# Patient Record
Sex: Female | Born: 1950 | Race: Asian | Hispanic: No | Marital: Married | Smoking: Never smoker
Health system: Southern US, Community
[De-identification: ages and names within clinical notes are randomized; demographics above are authoritative.]

## PROBLEM LIST (undated history)

## (undated) DIAGNOSIS — H43812 Vitreous degeneration, left eye: Secondary | ICD-10-CM

## (undated) DIAGNOSIS — E559 Vitamin D deficiency, unspecified: Secondary | ICD-10-CM

## (undated) DIAGNOSIS — E785 Hyperlipidemia, unspecified: Secondary | ICD-10-CM

## (undated) DIAGNOSIS — E119 Type 2 diabetes mellitus without complications: Secondary | ICD-10-CM

## (undated) HISTORY — DX: Vitamin D deficiency, unspecified: E55.9

## (undated) HISTORY — DX: Hyperlipidemia, unspecified: E78.5

## (undated) HISTORY — DX: Type 2 diabetes mellitus without complications: E11.9

## (undated) HISTORY — DX: Vitreous degeneration, left eye: H43.812

---

## 1998-07-10 ENCOUNTER — Other Ambulatory Visit: Admission: RE | Admit: 1998-07-10 | Discharge: 1998-07-10 | Payer: Self-pay | Admitting: Obstetrics and Gynecology

## 2001-06-29 ENCOUNTER — Encounter: Payer: Self-pay | Admitting: Family Medicine

## 2001-06-29 ENCOUNTER — Encounter: Admission: RE | Admit: 2001-06-29 | Discharge: 2001-06-29 | Payer: Self-pay | Admitting: Family Medicine

## 2002-06-04 ENCOUNTER — Encounter: Admission: RE | Admit: 2002-06-04 | Discharge: 2002-06-04 | Payer: Self-pay | Admitting: Family Medicine

## 2002-06-04 ENCOUNTER — Encounter: Payer: Self-pay | Admitting: Family Medicine

## 2004-07-26 ENCOUNTER — Other Ambulatory Visit: Admission: RE | Admit: 2004-07-26 | Discharge: 2004-07-26 | Payer: Self-pay | Admitting: *Deleted

## 2005-08-12 ENCOUNTER — Encounter: Admission: RE | Admit: 2005-08-12 | Discharge: 2005-08-12 | Payer: Self-pay | Admitting: Family Medicine

## 2005-12-25 ENCOUNTER — Other Ambulatory Visit: Admission: RE | Admit: 2005-12-25 | Discharge: 2005-12-25 | Payer: Self-pay | Admitting: Family Medicine

## 2007-01-12 ENCOUNTER — Other Ambulatory Visit: Admission: RE | Admit: 2007-01-12 | Discharge: 2007-01-12 | Payer: Self-pay | Admitting: Family Medicine

## 2008-06-08 ENCOUNTER — Other Ambulatory Visit: Admission: RE | Admit: 2008-06-08 | Discharge: 2008-06-08 | Payer: Self-pay | Admitting: Family Medicine

## 2009-11-28 ENCOUNTER — Other Ambulatory Visit: Admission: RE | Admit: 2009-11-28 | Discharge: 2009-11-28 | Payer: Self-pay | Admitting: Family Medicine

## 2009-12-25 ENCOUNTER — Encounter: Admission: RE | Admit: 2009-12-25 | Discharge: 2009-12-25 | Payer: Self-pay | Admitting: Family Medicine

## 2010-12-05 ENCOUNTER — Other Ambulatory Visit: Payer: Self-pay | Admitting: Family Medicine

## 2010-12-05 ENCOUNTER — Other Ambulatory Visit
Admission: RE | Admit: 2010-12-05 | Discharge: 2010-12-05 | Payer: Self-pay | Source: Home / Self Care | Admitting: Family Medicine

## 2011-09-22 ENCOUNTER — Other Ambulatory Visit: Payer: Self-pay | Admitting: Family Medicine

## 2011-09-22 DIAGNOSIS — Z1231 Encounter for screening mammogram for malignant neoplasm of breast: Secondary | ICD-10-CM

## 2011-10-13 ENCOUNTER — Ambulatory Visit
Admission: RE | Admit: 2011-10-13 | Discharge: 2011-10-13 | Disposition: A | Discharge status: Home / Self Care | Payer: BC Managed Care – PPO | Source: Ambulatory Visit | Attending: Family Medicine | Admitting: Family Medicine

## 2011-10-13 DIAGNOSIS — Z1231 Encounter for screening mammogram for malignant neoplasm of breast: Secondary | ICD-10-CM

## 2014-07-27 ENCOUNTER — Other Ambulatory Visit: Payer: Self-pay | Admitting: Family Medicine

## 2014-07-27 ENCOUNTER — Other Ambulatory Visit (HOSPITAL_COMMUNITY)
Admission: RE | Admit: 2014-07-27 | Discharge: 2014-07-27 | Disposition: A | Discharge status: Home / Self Care | Payer: BC Managed Care – PPO | Source: Ambulatory Visit | Attending: Family Medicine | Admitting: Family Medicine

## 2014-07-27 DIAGNOSIS — Z124 Encounter for screening for malignant neoplasm of cervix: Secondary | ICD-10-CM | POA: Diagnosis not present

## 2014-07-31 LAB — CYTOLOGY - PAP

## 2015-10-26 ENCOUNTER — Ambulatory Visit (INDEPENDENT_AMBULATORY_CARE_PROVIDER_SITE_OTHER): Payer: BC Managed Care – PPO | Admitting: Neurology

## 2015-10-26 ENCOUNTER — Other Ambulatory Visit: Payer: Self-pay | Admitting: Family Medicine

## 2015-10-26 ENCOUNTER — Encounter: Payer: Self-pay | Admitting: Neurology

## 2015-10-26 VITALS — BP 104/64 | HR 81 | Ht 64.0 in | Wt 111.4 lb

## 2015-10-26 DIAGNOSIS — Z1231 Encounter for screening mammogram for malignant neoplasm of breast: Secondary | ICD-10-CM

## 2015-10-26 DIAGNOSIS — G451 Carotid artery syndrome (hemispheric): Secondary | ICD-10-CM | POA: Diagnosis not present

## 2015-10-26 DIAGNOSIS — E1159 Type 2 diabetes mellitus with other circulatory complications: Secondary | ICD-10-CM | POA: Diagnosis not present

## 2015-10-26 DIAGNOSIS — E785 Hyperlipidemia, unspecified: Secondary | ICD-10-CM | POA: Diagnosis not present

## 2015-10-26 DIAGNOSIS — G459 Transient cerebral ischemic attack, unspecified: Secondary | ICD-10-CM | POA: Insufficient documentation

## 2015-10-26 MED ORDER — ASPIRIN EC 81 MG PO TBEC: 81.0000 mg | DELAYED_RELEASE_TABLET | Freq: Every day | ORAL | Status: AC

## 2015-10-26 NOTE — Patient Instructions (Signed)
-   recommend ASA 81mg  for stroke prevention - recommend statin meds for high LDL and please discuss with Dr. Kevan NyGates, goal LDL < 70 - will do MRI and MRA head, carotid doppler and 2D echo for TIA work up - Follow up with your primary care physician for stroke risk factor modification. Recommend maintain blood pressure goal <130/80, diabetes with hemoglobin A1c goal below 6.5% and lipids with LDL cholesterol goal below 70 mg/dL.  - follow up in 2 months

## 2015-10-26 NOTE — Progress Notes (Signed)
NEUROLOGY CLINIC NEW PATIENT NOTE  NAME: Brandy Doyle DOB: Feb 14, 1951 REFERRING PHYSICIAN: Darcus Austin, MD  I saw Brandy Doyle as a new consult in the neurovascular clinic today regarding  Chief Complaint  Patient presents with  . Referral    from Darcus Austin MD  .  HPI: Brandy Doyle is a 64 y.o. female with PMH of DM and HLD who presents as a new patient for episodes of blurry vision or left leg weakness.   Pt stated that she had two episode of blurry vision. The first one was last summer when she was driving, she had sudden onset binocular blurry vision. She blinked her eyes but did not work. She pulled over and the vision back to baseline in 31mn. The second episode happened this year lasted about 133m. No associated HA, double vision, speech difficulty or facial droop, weakness or numbness.   About several months ago, she had one episode of left leg weakness sudden onset on walking. She has to sit down and the episode lasted about 1531mand resolved. She denies any left arm or face weakness or numbness.   She has hx of HLD and stated it was getting better. However the most recent LDL was 143. She has not been on any meds but insisted to try diet control. She also has DM for 20 years, and on Prandin for DM control and recent A1C 7.3. She denies HTN. Denies smoking, alcohol or illicit drugs. She admitted snoring but denies apnea or daytime sleepiness.  She has FHx of HLD in mom and sister and brother. Mom had MI/CAD and sister died of sudden death. Brother recently was diagnosed with stroke and carotid disease.   Past Medical History  Diagnosis Date  . [place] complication ([place] . Vitamin D deficiency   . Hyperlipemia   . Vitreous detachment of left eye    No past surgical history on file. Family History  Problem Relation Age of Onset  . Migraines Brother    Current Outpatient Prescriptions  Medication Sig Dispense Refill  . Blood Glucose Monitoring Suppl  (ONE TOUCH ULTRA MINI) W/DEVICE KIT     . Calcium Citrate-Vitamin D (CALCIUM + D PO) Take by mouth.    . ONE TOUCH ULTRA TEST test strip     . ONETOUCH DELICA LANCETS [id number]     . repaglinide (PRANDIN) 0.5 MG tablet     . Vitamin D, Ergocalciferol, (DRISDOL) 50000 UNITS CAPS capsule     . aspirin EC 81 MG tablet Take 1 tablet (81 mg total) by mouth daily. 30 tablet 5   No current facility-administered medications for this visit.   Allergies  Allergen Reactions  . Erythromycin Base Swelling    The ointment cause swelling  . Penicillins Rash   Social History   Social History  . Marital Status: Married    Spouse Name: N/A  . Number of Children: N/A  . Years of Education: N/A   Occupational History  . Not on file.   Social History Main Topics  . Smoking status: Never Smoker   . Smokeless tobacco: Not on file  . Alcohol Use: No  . Drug Use: Not on file  . Sexual Activity: Not on file   Other Topics Concern  . Not on file   Social History Narrative    Review of Systems Full 14 system review of systems performed and notable only for those listed, all others are neg:  Constitutional:  Cardiovascular:  Ear/Nose/Throat:   Skin:  Eyes:  Blurry vision Respiratory:  snoring Gastroitestinal:   Genitourinary:  Hematology/Lymphatic:   Endocrine:  Musculoskeletal:   Allergy/Immunology:  allergies Neurological:  HA Psychiatric:  Sleep:    Physical Exam  Filed Vitals:   10/26/15 0939  BP: 104/64  Pulse: 81    General - Well nourished, well developed, in no apparent distress.  Ophthalmologic - Sharp disc margins OU.  Cardiovascular - Regular rate and rhythm with no murmur.    Neck - supple, no nuchal rigidity.  Mental Status -  Level of arousal and orientation to time, place, and person were intact. Language including expression, naming, repetition, comprehension, reading, and writing was assessed and found intact. Attention span and concentration were  normal. Recent and remote memory were intact. Fund of Knowledge was assessed and was intact.  Cranial Nerves II - XII - II - Visual field intact OU. III, IV, VI - Extraocular movements intact. V - Facial sensation intact bilaterally. VII - Facial movement intact bilaterally. VIII - Hearing & vestibular intact bilaterally. X - Palate elevates symmetrically. XI - Chin turning & shoulder shrug intact bilaterally. XII - Tongue protrusion intact.  Motor Strength - The patient's strength was normal in all extremities and pronator drift was absent.  Bulk was normal and fasciculations were absent.   Motor Tone - Muscle tone was assessed at the neck and appendages and was normal.  Reflexes - The patient's reflexes were normal in all extremities and she had no pathological reflexes.  Sensory - Light touch, temperature/pinprick, vibration and proprioception, and Romberg testing were assessed and were normal.    Coordination - The patient had normal movements in the hands and feet with no ataxia or dysmetria.  Tremor was absent.  Gait and Station - The patient's transfers, posture, gait, station, and turns were observed as normal.   Imaging None  Lab Review 10/22/15 A1C 6.3, LDL 143, HDL 40, chol 223, TG 200, Cre 0.59    Assessment and Plan:   In summary, Brandy Doyle is a 64 y.o. female with PMH of HLD and DM presents for evaluation of transient episodes of binocular blurry vision or left leg weakness. The episodes concerning for TIAs given her risk factors and time course of the episodes. She will need TIA work up. Will do MRI and MRA, CUS, TTE. Recommend to start ASA and statin. Pt would like to discuss with Dr. Inda Merlin for statin choices.   - ASA 37m for stroke prevention - recommend statin meds for high LDL and pt would discuss with Dr. GInda Merlin goal LDL < 70 - MRI and MRA head, CUS and 2D echo for TIA work up - Follow up with your primary care physician for stroke risk factor  modification. Recommend maintain blood pressure goal <130/80, diabetes with hemoglobin A1c goal below 6.5% and lipids with LDL cholesterol goal below 70 mg/dL.  - follow up in 2 months  I recommend aggressive blood pressure control with a goal <130/80 mm Hg.  Lipids should be managed intensively, with a goal LDL < 70 mg/dL.  I encouraged the patient to discuss these important issues with her primary care physician.  I counseled the patient on measures to reduce stroke risk, including the importance of medication compliance, risk factor control, exercise, healthy diet, and avoidance of smoking.  I reviewed stroke warning signs and symptoms and appropriate actions to take if such occurs.   Thank you very much for the opportunity to participate in  the care of this patient.  Please do not hesitate to call if any questions or concerns arise.  Orders Placed This Encounter  Procedures  . MR Brain Wo Contrast    Standing Status: Future     Number of Occurrences:      Standing Expiration Date: 12/27/2016    Order Specific Question:  Reason for Exam (SYMPTOM  OR DIAGNOSIS REQUIRED)    Answer:  TIA    Order Specific Question:  Preferred imaging location?    Answer:  Internal    Order Specific Question:  Does the patient have a pacemaker or implanted devices?    Answer:  Yes    Order Specific Question:  What is the patient's sedation requirement?    Answer:  No Sedation  . MR MRA HEAD WO CONTRAST    Standing Status: Future     Number of Occurrences:      Standing Expiration Date: 12/27/2016    Order Specific Question:  Reason for Exam (SYMPTOM  OR DIAGNOSIS REQUIRED)    Answer:  TIA    Order Specific Question:  Preferred imaging location?    Answer:  Internal    Order Specific Question:  Does the patient have a pacemaker or implanted devices?    Answer:  Yes    Order Specific Question:  What is the patient's sedation requirement?    Answer:  No Sedation  . ECHOCARDIOGRAM COMPLETE    Standing  Status: Future     Number of Occurrences:      Standing Expiration Date: 01/24/2017    Order Specific Question:  Where should this test be performed    Answer:  [facility])    Order Specific Question:  Complete or Limited study?    Answer:  Complete    Order Specific Question:  With Image Enhancing Agent or without Image Enhancing Agent?    Answer:  With Image Enhancing Agent    Order Specific Question:  Reason for exam-Echo    Answer:  TIA  435.9 / G45.9    Meds ordered this encounter  Medications  . ONETOUCH DELICA LANCETS 98M MISC    Sig:   . ONE TOUCH ULTRA TEST test strip    Sig:   . Blood Glucose Monitoring Suppl (ONE TOUCH ULTRA MINI) W/DEVICE KIT    Sig:   . Vitamin D, Ergocalciferol, (DRISDOL) 50000 UNITS CAPS capsule    Sig:   . repaglinide (PRANDIN) 0.5 MG tablet    Sig:   . Calcium Citrate-Vitamin D (CALCIUM + D PO)    Sig: Take by mouth.  Marland Kitchen aspirin EC 81 MG tablet    Sig: Take 1 tablet (81 mg total) by mouth daily.    Dispense:  30 tablet    Refill:  5    Patient Instructions  - recommend ASA 51m for stroke prevention - recommend statin meds for high LDL and please discuss with Dr. GInda Merlin goal LDL < 70 - will do MRI and MRA head, carotid doppler and 2D echo for TIA work up - Follow up with your primary care physician for stroke risk factor modification. Recommend maintain blood pressure goal <130/80, diabetes with hemoglobin A1c goal below 6.5% and lipids with LDL cholesterol goal below 70 mg/dL.  - follow up in 2 months   JRosalin Hawking MD PhD [facility] [address] [place]  [phone]

## 2015-11-07 ENCOUNTER — Encounter (HOSPITAL_COMMUNITY): Payer: BC Managed Care – PPO

## 2015-11-14 ENCOUNTER — Other Ambulatory Visit (HOSPITAL_COMMUNITY): Payer: BC Managed Care – PPO

## 2015-11-20 ENCOUNTER — Encounter (HOSPITAL_COMMUNITY): Payer: BC Managed Care – PPO

## 2015-11-21 ENCOUNTER — Ambulatory Visit (INDEPENDENT_AMBULATORY_CARE_PROVIDER_SITE_OTHER): Payer: BC Managed Care – PPO

## 2015-11-21 DIAGNOSIS — G451 Carotid artery syndrome (hemispheric): Secondary | ICD-10-CM

## 2015-11-22 ENCOUNTER — Ambulatory Visit (HOSPITAL_COMMUNITY): Payer: BC Managed Care – PPO | Attending: Cardiology

## 2015-11-22 ENCOUNTER — Other Ambulatory Visit: Payer: Self-pay

## 2015-11-22 DIAGNOSIS — G451 Carotid artery syndrome (hemispheric): Secondary | ICD-10-CM | POA: Diagnosis not present

## 2015-11-22 DIAGNOSIS — I5189 Other ill-defined heart diseases: Secondary | ICD-10-CM | POA: Diagnosis not present

## 2015-11-22 DIAGNOSIS — E785 Hyperlipidemia, unspecified: Secondary | ICD-10-CM | POA: Diagnosis not present

## 2015-11-22 DIAGNOSIS — E119 Type 2 diabetes mellitus without complications: Secondary | ICD-10-CM | POA: Diagnosis not present

## 2015-11-22 DIAGNOSIS — I071 Rheumatic tricuspid insufficiency: Secondary | ICD-10-CM | POA: Insufficient documentation

## 2015-11-22 DIAGNOSIS — G459 Transient cerebral ischemic attack, unspecified: Secondary | ICD-10-CM | POA: Diagnosis present

## 2015-11-23 ENCOUNTER — Telehealth: Payer: Self-pay

## 2015-11-23 NOTE — Telephone Encounter (Signed)
-----   Message from Marvel PlanJindong Xu, MD sent at 11/23/2015  1:10 PM EST ----- Hi, Brandy Doyle:  Could you please let the patient know that her MRI brain and vessel images as well as the heart ultrasound were all normal. Still waiting for the carotid artery ultrasound test. Please continue current treatment. Thanks.  In addition, could you please find out her carotid doppler status? I have ordered on 10/26/15. Thanks.  Marvel PlanJindong Xu, MD PhD Stroke Neurology 11/23/2015 1:10 PM

## 2015-11-23 NOTE — Telephone Encounter (Signed)
LFt vm for patient to call back about her MRI, and heart ultrasound. Pt was schedule to have carotid dopplers done, but she cancel both appts according to the appts.

## 2015-11-26 ENCOUNTER — Ambulatory Visit (HOSPITAL_COMMUNITY)
Admission: RE | Admit: 2015-11-26 | Discharge: 2015-11-26 | Disposition: A | Discharge status: Home / Self Care | Payer: BC Managed Care – PPO | Source: Ambulatory Visit | Attending: Neurology | Admitting: Neurology

## 2015-11-26 ENCOUNTER — Other Ambulatory Visit: Payer: Self-pay | Admitting: Neurology

## 2015-11-26 DIAGNOSIS — G451 Carotid artery syndrome (hemispheric): Secondary | ICD-10-CM

## 2015-11-26 DIAGNOSIS — I6523 Occlusion and stenosis of bilateral carotid arteries: Secondary | ICD-10-CM | POA: Insufficient documentation

## 2015-11-26 DIAGNOSIS — E785 Hyperlipidemia, unspecified: Secondary | ICD-10-CM | POA: Diagnosis not present

## 2015-11-26 DIAGNOSIS — E119 Type 2 diabetes mellitus without complications: Secondary | ICD-10-CM | POA: Insufficient documentation

## 2015-11-26 NOTE — Telephone Encounter (Signed)
-----   Message from Jindong Xu, MD sent at 11/23/2015  1:10 PM EST ----- Hi, Brandy Doyle:  Could you please let the patient know that her MRI brain and vessel images as well as the heart ultrasound were all normal. Still waiting for the carotid artery ultrasound test. Please continue current treatment. Thanks.  In addition, could you please find out her carotid doppler status? I have ordered on 10/26/15. Thanks.  Jindong Xu, MD PhD Stroke Neurology 11/23/2015 1:10 PM     

## 2015-11-26 NOTE — Telephone Encounter (Signed)
Rn receive incoming call from patient that her MRI brain was normal, heart ultrasound was normal. Pt verbalized understanding of the test. Rn [facility] patient [facility]

## 2015-11-26 NOTE — Telephone Encounter (Signed)
LFt vm for patient about MR results. [facility].

## 2015-11-28 ENCOUNTER — Telehealth: Payer: Self-pay | Admitting: *Deleted

## 2015-11-28 NOTE — Telephone Encounter (Signed)
Called pt and relayed results below per Dr Roda Shutters. Pt verbalized understanding.

## 2015-11-28 NOTE — Telephone Encounter (Signed)
-----   Message from Marvel Plan, MD sent at 11/27/2015  8:52 PM EST ----- Could you please let the patient know that the carotid doppler test done recently was negative for any significant narrowing. Please continue current treatment. Thanks.  Marvel Plan, MD PhD Stroke Neurology 11/27/2015 8:52 PM

## 2015-12-20 ENCOUNTER — Ambulatory Visit
Admission: RE | Admit: 2015-12-20 | Discharge: 2015-12-20 | Disposition: A | Discharge status: Home / Self Care | Payer: BC Managed Care – PPO | Source: Ambulatory Visit | Attending: Family Medicine | Admitting: Family Medicine

## 2015-12-20 DIAGNOSIS — Z1231 Encounter for screening mammogram for malignant neoplasm of breast: Secondary | ICD-10-CM

## 2016-01-17 ENCOUNTER — Ambulatory Visit (INDEPENDENT_AMBULATORY_CARE_PROVIDER_SITE_OTHER): Payer: BC Managed Care – PPO | Admitting: Neurology

## 2016-01-17 ENCOUNTER — Encounter: Payer: Self-pay | Admitting: Neurology

## 2016-01-17 VITALS — BP 106/67 | HR 77 | Ht 64.0 in | Wt 112.6 lb

## 2016-01-17 DIAGNOSIS — E785 Hyperlipidemia, unspecified: Secondary | ICD-10-CM | POA: Diagnosis not present

## 2016-01-17 DIAGNOSIS — G451 Carotid artery syndrome (hemispheric): Secondary | ICD-10-CM

## 2016-01-17 NOTE — Patient Instructions (Addendum)
-   continue ASA 81mg  for stroke prevention - recommend statin meds for high LDL and pt would discuss with Dr. Kevan NyGates, goal LDL < 70 - Follow up with your primary care physician for stroke risk factor modification. Recommend maintain blood pressure goal <130/80, diabetes with hemoglobin A1c goal below 6.5% and lipids with LDL cholesterol goal below 70 mg/dL.  - health diet and regular exercise.  - follow up as needed.

## 2016-01-17 NOTE — Progress Notes (Signed)
NEUROLOGY CLINIC NEW PATIENT NOTE  NAME: Brandy Doyle Doyle DOB: 04-10-1951 REFERRING PHYSICIAN: No ref. provider found  I saw Brandy Doyle Doyle as a new consult in the neurovascular clinic today regarding  Chief Complaint  Patient presents with  . Follow-up    Hemispheric carotid artery syndrome   .  HPI: Brandy Doyle Doyle is a 65 y.o. female with PMH of DM and HLD who presents as a new patient for episodes of blurry vision or left leg weakness.   Pt stated that she had two episode of blurry vision. The first one was last summer when she was driving, she had sudden onset binocular blurry vision. She blinked her eyes but did not work. She pulled over and the vision back to baseline in 37mn. The second episode happened this year lasted about 19m. No associated HA, double vision, speech difficulty or facial droop, weakness or numbness.   About several months ago, she had one episode of left leg weakness sudden onset on walking. She has to sit down and the episode lasted about 1525mand resolved. She denies any left arm or face weakness or numbness.   She has hx of HLD and stated it was getting better. However the most recent LDL was 143. She has not been on any meds but insisted to try diet control. She also has DM for 20 years, and on Prandin for DM control and recent A1C 7.3. She denies HTN. Denies smoking, alcohol or illicit drugs. She admitted snoring but denies apnea or daytime sleepiness.  She has FHx of HLD in mom and sister and brother. Mom had MI/CAD and sister died of sudden death. Brother recently was diagnosed with stroke and carotid disease.   Interval history: During the interval time, pt has been doing well. No recurrent symptoms. MRI and MRA, CUS and TTE all negative. She is doing regular exercise and health diet. But not compliant with ASA 81 mg and not started statin yet. BP today 106/67.  Past Medical History  Diagnosis Date  . Diabetes [place] ([place] . Vitamin D  deficiency   . Hyperlipemia   . Vitreous detachment of left eye    History reviewed. No pertinent past surgical history. Family History  Problem Relation Age of Onset  . Migraines Brother    Current Outpatient Prescriptions  Medication Sig Dispense Refill  . aspirin EC 81 MG tablet Take 1 tablet (81 mg total) by mouth daily. 30 tablet 5  . Blood Glucose Monitoring Suppl (ONE TOUCH ULTRA MINI) W/DEVICE KIT     . Calcium Citrate-Vitamin D (CALCIUM + D PO) Take by mouth.    . ONE TOUCH ULTRA TEST test strip     . ONETOUCH DELICA LANCETS [id number]     . repaglinide (PRANDIN) 0.5 MG tablet     . Vitamin D, Ergocalciferol, (DRISDOL) 50000 UNITS CAPS capsule      No current facility-administered medications for this visit.   Allergies  Allergen Reactions  . Erythromycin Base Swelling    The ointment cause swelling  . Penicillins Rash   Social History   Social History  . Marital Status: Married    Spouse Name: N/A  . Number of Children: N/A  . Years of Education: N/A   Occupational History  . Not on file.   Social History Main Topics  . Smoking status: Never Smoker   . Smokeless tobacco: Not on file  . Alcohol Use: No  . Drug Use: Not on file  .  Sexual Activity: Not on file   Other Topics Concern  . Not on file   Social History Narrative    Review of Systems Full 14 system review of systems performed and notable only for those listed, all others are neg:  Constitutional:   Cardiovascular:  Ear/Nose/Throat:   Skin:  Eyes:  Blurry vision Respiratory:  snoring Gastroitestinal:   Genitourinary:  Hematology/Lymphatic:   Endocrine:  Musculoskeletal:   Allergy/Immunology:  allergies Neurological:  HA Psychiatric:  Sleep:    Physical Exam  Filed Vitals:   01/17/16 0909  BP: 106/67  Pulse: 77    General - Well nourished, well developed, in no apparent distress.  Ophthalmologic - Sharp disc margins OU.  Cardiovascular - Regular rate and rhythm with no  murmur.    Neck - supple, no nuchal rigidity.  Mental Status -  Level of arousal and orientation to time, place, and person were intact. Language including expression, naming, repetition, comprehension, reading, and writing was assessed and found intact. Attention span and concentration were normal. Recent and remote memory were intact. Fund of Knowledge was assessed and was intact.  Cranial Nerves II - XII - II - Visual field intact OU. III, IV, VI - Extraocular movements intact. V - Facial sensation intact bilaterally. VII - Facial movement intact bilaterally. VIII - Hearing & vestibular intact bilaterally. X - Palate elevates symmetrically. XI - Chin turning & shoulder shrug intact bilaterally. XII - Tongue protrusion intact.  Motor Strength - The patient's strength was normal in all extremities and pronator drift was absent.  Bulk was normal and fasciculations were absent.   Motor Tone - Muscle tone was assessed at the neck and appendages and was normal.  Reflexes - The patient's reflexes were normal in all extremities and she had no pathological reflexes.  Sensory - Light touch, temperature/pinprick, vibration and proprioception, and Romberg testing were assessed and were normal.    Coordination - The patient had normal movements in the hands and feet with no ataxia or dysmetria.  Tremor was absent.  Gait and Station - The patient's transfers, posture, gait, station, and turns were observed as normal.   Imaging MRI and MRA - normal MRI and MRA head.  CUS - Bilateral: 1-39% ICA stenosis. Vertebral artery flow is antegrade.  TTE -  - Normal LV function; mild diastolic dysfunction; trace TR.  Lab Review 10/22/15 A1C 6.3, LDL 143, HDL 40, chol 223, TG 200, Cre 0.59    Assessment:   In summary, Brandy Doyle Doyle is a 65 y.o. female with PMH of HLD and DM presents for evaluation of transient episodes of binocular blurry vision or left leg weakness. The episodes concerning for  TIAs given her risk factors and time course of the episodes. Work up including MRI and MRA, CUS, TTE all negative. Recommend to be compliant with ASA and discuss with Dr. Inda Doyle for statin choices.   Plan: - continue ASA 36m for stroke prevention - recommend statin meds for high LDL and pt would discuss with Dr. GInda Doyle goal LDL < 70 - Follow up with your primary care physician for stroke risk factor modification. Recommend maintain blood pressure goal <130/80, diabetes with hemoglobin A1c goal below 6.5% and lipids with LDL cholesterol goal below 70 mg/dL.  - health diet and regular exercise.  - follow up as needed.   No orders of the defined types were placed in this encounter.    No orders of the defined types were placed in this encounter.  Patient Instructions  - continue ASA 50m for stroke prevention - recommend statin meds for high LDL and pt would discuss with Dr. GInda Doyle goal LDL < 70 - Follow up with your primary care physician for stroke risk factor modification. Recommend maintain blood pressure goal <130/80, diabetes with hemoglobin A1c goal below 6.5% and lipids with LDL cholesterol goal below 70 mg/dL.  - health diet and regular exercise.  - follow up as needed.    JRosalin Hawking MD PhD [facility] [address] [place]andas[place]amp[place] [phone]

## 2016-01-24 ENCOUNTER — Other Ambulatory Visit: Payer: Self-pay | Admitting: Family Medicine

## 2016-01-24 DIAGNOSIS — R221 Localized swelling, mass and lump, neck: Secondary | ICD-10-CM

## 2016-01-28 ENCOUNTER — Ambulatory Visit
Admission: RE | Admit: 2016-01-28 | Discharge: 2016-01-28 | Disposition: A | Discharge status: Home / Self Care | Payer: BC Managed Care – PPO | Source: Ambulatory Visit | Attending: Family Medicine | Admitting: Family Medicine

## 2016-01-28 DIAGNOSIS — R221 Localized swelling, mass and lump, neck: Secondary | ICD-10-CM

## 2016-02-04 ENCOUNTER — Other Ambulatory Visit: Payer: Self-pay | Admitting: Family Medicine

## 2016-02-04 DIAGNOSIS — E041 Nontoxic single thyroid nodule: Secondary | ICD-10-CM

## 2016-02-06 ENCOUNTER — Other Ambulatory Visit (HOSPITAL_COMMUNITY)
Admission: RE | Admit: 2016-02-06 | Discharge: 2016-02-06 | Disposition: A | Discharge status: Home / Self Care | Payer: BC Managed Care – PPO | Source: Ambulatory Visit | Attending: Radiology | Admitting: Radiology

## 2016-02-06 ENCOUNTER — Ambulatory Visit
Admission: RE | Admit: 2016-02-06 | Discharge: 2016-02-06 | Disposition: A | Discharge status: Home / Self Care | Payer: BC Managed Care – PPO | Source: Ambulatory Visit | Attending: Family Medicine | Admitting: Family Medicine

## 2016-02-06 DIAGNOSIS — E041 Nontoxic single thyroid nodule: Secondary | ICD-10-CM

## 2016-03-27 ENCOUNTER — Other Ambulatory Visit: Payer: Self-pay | Admitting: Family Medicine

## 2016-03-27 DIAGNOSIS — E041 Nontoxic single thyroid nodule: Secondary | ICD-10-CM

## 2016-08-06 ENCOUNTER — Ambulatory Visit: Payer: BC Managed Care – PPO | Admitting: Internal Medicine

## 2016-12-16 ENCOUNTER — Other Ambulatory Visit: Payer: Self-pay | Admitting: Internal Medicine

## 2016-12-16 DIAGNOSIS — E042 Nontoxic multinodular goiter: Secondary | ICD-10-CM

## 2017-01-12 ENCOUNTER — Other Ambulatory Visit: Payer: BC Managed Care – PPO

## 2017-01-13 ENCOUNTER — Ambulatory Visit
Admission: RE | Admit: 2017-01-13 | Discharge: 2017-01-13 | Disposition: A | Discharge status: Home / Self Care | Payer: BC Managed Care – PPO | Source: Ambulatory Visit | Attending: Internal Medicine | Admitting: Internal Medicine

## 2017-01-13 DIAGNOSIS — E042 Nontoxic multinodular goiter: Secondary | ICD-10-CM

## 2017-11-27 ENCOUNTER — Other Ambulatory Visit: Payer: Self-pay | Admitting: Internal Medicine

## 2017-11-27 DIAGNOSIS — E042 Nontoxic multinodular goiter: Secondary | ICD-10-CM

## 2018-01-05 ENCOUNTER — Ambulatory Visit
Admission: RE | Admit: 2018-01-05 | Discharge: 2018-01-05 | Disposition: A | Discharge status: Home / Self Care | Payer: BC Managed Care – PPO | Source: Ambulatory Visit | Attending: Internal Medicine | Admitting: Internal Medicine

## 2018-01-05 DIAGNOSIS — E042 Nontoxic multinodular goiter: Secondary | ICD-10-CM

## 2019-12-22 ENCOUNTER — Ambulatory Visit: Payer: BC Managed Care – PPO | Attending: Family

## 2019-12-22 DIAGNOSIS — Z23 Encounter for immunization: Secondary | ICD-10-CM | POA: Insufficient documentation

## 2019-12-22 NOTE — Progress Notes (Signed)
   Covid-19 Vaccination Clinic  Name:  [name]    MRN: [id number] DOB: December 22, 1950  12/22/2019  Brandy Doyle was observed post Covid-19 immunization for 15 minutes without incidence. She was provided with Vaccine Information Sheet and instruction to access the V-Safe system.   [name] was instructed to call 911 with any severe reactions post vaccine: Marland Kitchen Difficulty breathing  . Swelling of your face and throat  . A fast heartbeat  . A bad rash all over your body  . Dizziness and weakness    Immunizations Administered    Name Date Dose VIS Date Route   Moderna COVID-19 Vaccine 12/22/2019  2:24 PM 0.5 mL 10/11/2019 Intramuscular   Manufacturer: Moderna   Lot: [id number]   NDC: 59093-112-16

## 2020-01-24 ENCOUNTER — Ambulatory Visit: Payer: BC Managed Care – PPO | Attending: Family

## 2020-01-24 DIAGNOSIS — Z23 Encounter for immunization: Secondary | ICD-10-CM

## 2020-01-24 NOTE — Progress Notes (Signed)
   Covid-19 Vaccination Clinic  Name:  Brandy Doyle    MRN: [id number] DOB: Sep 08, 1951  01/24/2020  [name] was observed post Covid-19 immunization for 15 minutes without incident. She was provided with Vaccine Information Sheet and instruction to access the V-Safe system.   [name] was instructed to call 911 with any severe reactions post vaccine: Marland Kitchen Difficulty breathing  . Swelling of face and throat  . A fast heartbeat  . A bad rash all over body  . Dizziness and weakness   Immunizations Administered    Name Date Dose VIS Date Route   Moderna COVID-19 Vaccine 01/24/2020  2:05 PM 0.5 mL 10/11/2019 Intramuscular   Manufacturer: Moderna   Lot: [id number]   NDC: 47092-957-47

## 2020-09-27 ENCOUNTER — Ambulatory Visit: Payer: BC Managed Care – PPO | Attending: Family

## 2020-09-27 DIAGNOSIS — Z23 Encounter for immunization: Secondary | ICD-10-CM

## 2020-12-03 ENCOUNTER — Other Ambulatory Visit (HOSPITAL_COMMUNITY): Payer: Self-pay | Admitting: Family Medicine

## 2020-12-03 ENCOUNTER — Other Ambulatory Visit: Payer: Self-pay | Admitting: Family Medicine

## 2020-12-03 DIAGNOSIS — R55 Syncope and collapse: Secondary | ICD-10-CM

## 2020-12-03 DIAGNOSIS — E041 Nontoxic single thyroid nodule: Secondary | ICD-10-CM

## 2021-01-04 ENCOUNTER — Ambulatory Visit
Admission: RE | Admit: 2021-01-04 | Discharge: 2021-01-04 | Disposition: A | Discharge status: Home / Self Care | Payer: BC Managed Care – PPO | Source: Ambulatory Visit | Attending: Family Medicine | Admitting: Family Medicine

## 2021-01-04 DIAGNOSIS — E041 Nontoxic single thyroid nodule: Secondary | ICD-10-CM

## 2021-01-09 NOTE — Progress Notes (Signed)
   Covid-19 Vaccination Clinic  Name:  Laddie Naeem    MRN: [id number] DOB: 15-Nov-1950  01/09/2021  Ms. Jaspers was observed post Covid-19 immunization for 15 minutes without incident. She was provided with Vaccine Information Sheet and instruction to access the V-Safe system.   Ms. Engelken was instructed to call 911 with any severe reactions post vaccine: Marland Kitchen Difficulty breathing  . Swelling of face and throat  . A fast heartbeat  . A bad rash all over body  . Dizziness and weakness   Immunizations Administered    Name Date Dose VIS Date Route   Moderna Covid-19 Booster Vaccine 09/27/2020 12:15 PM 0.25 mL 08/29/2020 Intramuscular   Manufacturer: Moderna   Lot: [id number]   NDC: 33582-518-98

## 2021-01-10 ENCOUNTER — Ambulatory Visit (HOSPITAL_COMMUNITY): Payer: BC Managed Care – PPO | Attending: Cardiology

## 2021-01-10 ENCOUNTER — Other Ambulatory Visit: Payer: Self-pay

## 2021-01-10 DIAGNOSIS — R55 Syncope and collapse: Secondary | ICD-10-CM | POA: Diagnosis not present

## 2021-01-10 LAB — ECHOCARDIOGRAM COMPLETE
Area-P 1/2: 2.73 cm2
S' Lateral: 2.5 cm

## 2021-02-06 ENCOUNTER — Telehealth: Payer: Self-pay | Admitting: Cardiology

## 2021-02-06 NOTE — Telephone Encounter (Signed)
Spoke with the patient and advised her that her PCP ordered her echocardiogram so she should give her a call in regards to the results. Patient verbalized understanding.

## 2021-02-06 NOTE — Telephone Encounter (Signed)
Patient is requesting to go over 01/10/21 echo results.

## 2021-03-21 ENCOUNTER — Ambulatory Visit: Payer: BC Managed Care – PPO | Attending: Family

## 2021-03-21 DIAGNOSIS — Z23 Encounter for immunization: Secondary | ICD-10-CM

## 2021-04-19 NOTE — Progress Notes (Signed)
   Covid-19 Vaccination Clinic  Name:  Ioanna Colquhoun    MRN: [id number] DOB: 07-20-1951  04/19/2021  Ms. Rogowski was observed post Covid-19 immunization for 15 minutes without incident. She was provided with Vaccine Information Sheet and instruction to access the V-Safe system.   Ms. Lambright was instructed to call 911 with any severe reactions post vaccine: Difficulty breathing  Swelling of face and throat  A fast heartbeat  A bad rash all over body  Dizziness and weakness   Immunizations Administered     Name Date Dose VIS Date Route   Moderna Covid-19 Booster Vaccine 03/21/2021 10:30 AM 0.25 mL 08/29/2020 Intramuscular   Manufacturer: Moderna   Lot: [id number]   NDC: 97847-841-28

## 2021-10-01 ENCOUNTER — Ambulatory Visit: Payer: BC Managed Care – PPO | Attending: Family

## 2021-10-01 DIAGNOSIS — Z23 Encounter for immunization: Secondary | ICD-10-CM

## 2021-10-01 NOTE — Progress Notes (Signed)
   Covid-19 Vaccination Clinic  Name:  Brandy Doyle    MRN: [id number] DOB: 1951/10/15  10/01/2021  [name] was observed post Covid-19 immunization for 15 minutes without incident. She was provided with Vaccine Information Sheet and instruction to access the V-Safe system.   [name] was instructed to call 911 with any severe reactions post vaccine: Difficulty breathing  Swelling of face and throat  A fast heartbeat  A bad rash all over body  Dizziness and weakness   Immunizations Administered     Name Date Dose VIS Date Route   Moderna Covid-19 vaccine Bivalent Booster 10/01/2021  1:00 PM 0.5 mL 06/22/2021 Intramuscular   Manufacturer: Moderna   Lot: [id number]   NDC: 68032-122-48

## 2021-11-27 ENCOUNTER — Other Ambulatory Visit: Payer: Self-pay | Admitting: Family Medicine

## 2021-11-27 DIAGNOSIS — Z1231 Encounter for screening mammogram for malignant neoplasm of breast: Secondary | ICD-10-CM

## 2021-11-28 NOTE — Progress Notes (Signed)
Cardiology Office Note:    Date:  11/29/2021   ID:  Zaydee, Aina [birth year], MRN [id number]  PCP:  Maurice Small, MD  Cardiologist:  None  Electrophysiologist:  None   Referring MD: Glenis Smoker, *   Chief Complaint  Patient presents with   Palpitations    History of Present Illness:    Brandy Doyle is a 71 y.o. female with a hx of T2DM, hyperlipidemia who is referred by Dr. Lindell Noe for evaluation of palpitations.  She reports she had a syncopal episode while at [facility] in 10/2020.  States that she turned her head suddenly and then passed out.  Reports he was unconscious x5 minutes.  She was evaluated by medical team at [facility].  An EKG, EP, glucose unremarkable.  She went to the ED and had head CT and a brain MRI which she reports were unremarkable.  Reports had a recent fall in December 2022.  States that it was a mechanical fall, she turned and tripped and fell to the ground.  Denies any recent passing out.  She does report has been having palpitations every 1 to 2 days for 15 minutes up to 2 hours.  Denies any chest pain but reports some dyspnea.  She states she walks for 30 to 40 minutes daily, denies any exertional symptoms.  Denies any lower extremity edema.  No smoking history.  Family history includes mother died of MI in 61s, sister died of MI at age 54, other sister had PCI at 20.  Echocardiogram 01/10/2021 showed EF 65 to 70%, normal RV function, no significant valvular disease.  Past Medical History:  Diagnosis Date   Diabetes mellitus without complication ([place])    Hyperlipemia    Vitamin D deficiency    Vitreous detachment of left eye     No past surgical history on file.  Current Medications: Current Meds  Medication Sig   Blood Glucose Monitoring Suppl (ONE TOUCH ULTRA MINI) W/DEVICE KIT    Calcium Citrate-Vitamin D (CALCIUM + D PO) Take by mouth.   ONE TOUCH ULTRA TEST test strip    ONETOUCH DELICA LANCETS 29N MISC    repaglinide (PRANDIN) 0.5 MG  tablet    Vitamin D, Ergocalciferol, (DRISDOL) 50000 UNITS CAPS capsule      Allergies:   Erythromycin base and Penicillins   Social History   Socioeconomic History   Marital status: Married    Spouse name: Not on file   Number of children: Not on file   Years of education: Not on file   Highest education level: Not on file  Occupational History   Not on file  Tobacco Use   Smoking status: Never   Smokeless tobacco: Not on file  Substance and Sexual Activity   Alcohol use: No    Alcohol/week: 0.0 standard drinks   Drug use: Not on file   Sexual activity: Not on file  Other Topics Concern   Not on file  Social History Narrative   Not on file   Social Determinants of Health   Financial Resource Strain: Not on file  Food Insecurity: Not on file  Transportation Needs: Not on file  Physical Activity: Not on file  Stress: Not on file  Social Connections: Not on file     Family History: The patient's family history includes Migraines in her brother.  ROS:   Please see the history of present illness.     All other systems reviewed and are negative.  EKGs/Labs/Other Studies Reviewed:  The following studies were reviewed today:   EKG:   11/29/21: Normal sinus rhythm, rate 68, no ST abnormalities  Recent Labs: No results found for requested labs within last 8760 hours.  Recent Lipid Panel No results found for: CHOL, TRIG, HDL, CHOLHDL, VLDL, LDLCALC, LDLDIRECT  Physical Exam:    VS:  BP 122/60    Pulse 74    Ht '5\' 1"'  (1.549 m)    Wt 103 lb 9.6 oz (47 kg)    SpO2 98%    BMI 19.58 kg/m     Wt Readings from Last 3 Encounters:  11/29/21 103 lb 9.6 oz (47 kg)  01/17/16 112 lb 9.6 oz (51.1 kg)  11/20/15 111 lb (50.3 kg)     GEN:  Well nourished, well developed in no acute distress HEENT: Normal NECK: No JVD; No carotid bruits LYMPHATICS: No lymphadenopathy CARDIAC: RRR, no murmurs, rubs, gallops RESPIRATORY:  Clear to auscultation without rales, wheezing or  rhonchi  ABDOMEN: Soft, non-tender, non-distended MUSCULOSKELETAL:  No edema; No deformity  SKIN: Warm and dry NEUROLOGIC:  Alert and oriented x 3 PSYCHIATRIC:  Normal affect   ASSESSMENT:    1. Palpitations   2. Syncope and collapse   3. Hyperlipidemia, unspecified hyperlipidemia type    PLAN:    Palpitations: Description concerning for arrhythmia, will evaluate with Zio patch x2 weeks  Syncope: Occurred in December 2021.  Work-up unremarkable.  Echocardiogram 01/10/2021 showed EF 65 to 70%, normal RV function, no significant valvular disease.  Will check Zio patch x2 weeks as above.  T2DM: A1c 6.5%.  On metformin.  Hyperlipidemia: On rosuvastatin 2.5 mg daily.  LDL 99 on 03/20/2021.  Does have significant family history of CAD, recommend calcium score to guide how aggressive to be in lowering cholesterol  RTC in 6 months   Medication Adjustments/Labs and Tests Ordered: Current medicines are reviewed at length with the patient today.  Concerns regarding medicines are outlined above.  Orders Placed This Encounter  Procedures   CT CARDIAC SCORING (SELF PAY ONLY)   LONG TERM MONITOR (3-14 DAYS)   EKG 12-Lead   No orders of the defined types were placed in this encounter.   Patient Instructions  Medication Instructions:  The current medical regimen is effective;  continue present plan and medications.  *If you need a refill on your cardiac medications before your next appointment, please call your pharmacy*   Testing/Procedures: CALCIUM SCORE   [place] Monitor Instructions  Your physician has requested you wear a ZIO patch monitor for 14 days.  This is a single patch monitor. [employer] supplies one patch monitor per enrollment. Additional stickers are not available. Please do not apply patch if you will be having a Nuclear Stress Test,  Echocardiogram, Cardiac CT, MRI, or Chest Xray during the period you would be wearing the  monitor. The patch cannot be worn  during these tests. You cannot remove and re-apply the  ZIO XT patch monitor.  Your ZIO patch monitor will be mailed 3 day USPS to your address on file. It may take 3-5 days  to receive your monitor after you have been enrolled.  Once you have received your monitor, please review the enclosed instructions. Your monitor  has already been registered assigning a specific monitor serial # to you.  Billing and Patient Assistance Program Information  We have supplied [employer] with any of your insurance information on file for billing purposes. [employer] offers a sliding scale Patient Assistance Program for patients  that do not have  insurance, or whose insurance does not completely cover the cost of the ZIO monitor.  You must apply for the Patient Assistance Program to qualify for this discounted rate.  To apply, please call [employer] at [phone], select option 4, select option 2, ask to apply for  Patient Assistance Program. Theodore Demark will ask your household income, and how many people  are in your household. They will quote your out-of-pocket cost based on that information.  [employer] will also be able to set up a 64-month interest-free payment plan if needed.  Applying the monitor   Shave hair from upper left chest.  Hold abrader disc by orange tab. Rub abrader in 40 strokes over the upper left chest as  indicated in your monitor instructions.  Clean area with 4 enclosed alcohol pads. Let dry.  Apply patch as indicated in monitor instructions. Patch will be placed under collarbone on left  side of chest with arrow pointing upward.  Rub patch adhesive wings for 2 minutes. Remove white label marked "1". Remove the white  label marked "2". Rub patch adhesive wings for 2 additional minutes.  While looking in a mirror, press and release button in center of patch. A small green light will  flash 3-4 times. This will be your only indicator that the monitor has been turned on.  Do not shower for the  first 24 hours. You may shower after the first 24 hours.  Press the button if you feel a symptom. You will hear a small click. Record Date, Time and  Symptom in the Patient Logbook.  When you are ready to remove the patch, follow instructions on the last 2 pages of Patient  Logbook. Stick patch monitor onto the last page of Patient Logbook.  Place Patient Logbook in the blue and white box. Use locking tab on box and tape box closed  securely. The blue and white box has prepaid postage on it. Please place it in the mailbox as  soon as possible. Your physician should have your test results approximately 7 days after the  monitor has been [facility]  Call [employer]at [phone] you have questions regarding  your ZIO XT patch monitor. Call them immediately if you see an orange light blinking on your  monitor.  If your monitor falls off in less than 4 days, contact our Monitor department at [phone]  If your monitor becomes loose or falls off after 4 days call [employer] at [phone]  suggestions on securing your monitor    Follow-Up: At [facility] you and your health needs are our priority.  As part of our continuing mission to provide you with exceptional heart care, we have created designated Provider Care Teams.  These Care Teams include your primary Cardiologist (physician) and Advanced Practice Providers (APPs -  Physician Assistants and Nurse Practitioners) who all work together to provide you with the care you need, when you need it.  We recommend signing up for the patient portal called "MyChart".  Sign up information is provided on this After Visit Summary.  MyChart is used to connect with patients for Virtual Visits (Telemedicine).  Patients are able to view lab/test results, encounter notes, upcoming appointments, etc.  Non-urgent messages can be sent to your provider as well.   To learn more about what you can do with MyChart,  go to [email or web address]    Your next appointment:   6 month(s)  The format for  your next appointment:   In Person  Provider:   Oswaldo Milian, MD      Signed, Donato Heinz, MD  11/29/2021 10:58 AM    [place]

## 2021-11-29 ENCOUNTER — Other Ambulatory Visit: Payer: Self-pay

## 2021-11-29 ENCOUNTER — Encounter: Payer: Self-pay | Admitting: Cardiology

## 2021-11-29 ENCOUNTER — Ambulatory Visit: Payer: BC Managed Care – PPO | Admitting: Cardiology

## 2021-11-29 ENCOUNTER — Ambulatory Visit (INDEPENDENT_AMBULATORY_CARE_PROVIDER_SITE_OTHER): Payer: BC Managed Care – PPO

## 2021-11-29 VITALS — BP 122/60 | HR 74 | Ht 61.0 in | Wt 103.6 lb

## 2021-11-29 DIAGNOSIS — E785 Hyperlipidemia, unspecified: Secondary | ICD-10-CM | POA: Diagnosis not present

## 2021-11-29 DIAGNOSIS — R002 Palpitations: Secondary | ICD-10-CM

## 2021-11-29 DIAGNOSIS — R55 Syncope and collapse: Secondary | ICD-10-CM

## 2021-11-29 NOTE — Progress Notes (Unsigned)
Enrolled patient for a 14 day Zio XT  monitor to be mailed to patients home  °

## 2021-11-29 NOTE — Patient Instructions (Signed)
Medication Instructions:  The current medical regimen is effective;  continue present plan and medications.  *If you need a refill on your cardiac medications before your next appointment, please call your pharmacy*   Testing/Procedures: CALCIUM SCORE   [place] Monitor Instructions  Your physician has requested you wear a ZIO patch monitor for 14 days.  This is a single patch monitor. [employer] supplies one patch monitor per enrollment. Additional stickers are not available. Please do not apply patch if you will be having a Nuclear Stress Test,  Echocardiogram, Cardiac CT, MRI, or Chest Xray during the period you would be wearing the  monitor. The patch cannot be worn during these tests. You cannot remove and re-apply the  ZIO XT patch monitor.  Your ZIO patch monitor will be mailed 3 day USPS to your address on file. It may take 3-5 days  to receive your monitor after you have been enrolled.  Once you have received your monitor, please review the enclosed instructions. Your monitor  has already been registered assigning a specific monitor serial # to you.  Billing and Patient Assistance Program Information  We have supplied [employer] with any of your insurance information on file for billing purposes. [employer] offers a sliding scale Patient Assistance Program for patients that do not have  insurance, or whose insurance does not completely cover the cost of the ZIO monitor.  You must apply for the Patient Assistance Program to qualify for this discounted rate.  To apply, please call [employer] at [phone], select option 4, select option 2, ask to apply for  Patient Assistance Program. Theodore Demark will ask your household income, and how many people  are in your household. They will quote your out-of-pocket cost based on that information.  [employer] will also be able to set up a 80-month, interest-free payment plan if needed.  Applying the monitor   Shave hair from upper left chest.   Hold abrader disc by orange tab. Rub abrader in 40 strokes over the upper left chest as  indicated in your monitor instructions.  Clean area with 4 enclosed alcohol pads. Let dry.  Apply patch as indicated in monitor instructions. Patch will be placed under collarbone on left  side of chest with arrow pointing upward.  Rub patch adhesive wings for 2 minutes. Remove white label marked "1". Remove the white  label marked "2". Rub patch adhesive wings for 2 additional minutes.  While looking in a mirror, press and release button in center of patch. A small green light will  flash 3-4 times. This will be your only indicator that the monitor has been turned on.  Do not shower for the first 24 hours. You may shower after the first 24 hours.  Press the button if you feel a symptom. You will hear a small click. Record Date, Time and  Symptom in the Patient Logbook.  When you are ready to remove the patch, follow instructions on the last 2 pages of Patient  Logbook. Stick patch monitor onto the last page of Patient Logbook.  Place Patient Logbook in the blue and white box. Use locking tab on box and tape box closed  securely. The blue and white box has prepaid postage on it. Please place it in the mailbox as  soon as possible. Your physician should have your test results approximately 7 days after the  monitor has been mailed back to [facility].  Call [place] at [phone] if you have questions regarding  your ZIO XT patch monitor. Call them immediately if you see an orange light blinking on your  monitor.  If your monitor falls off in less than 4 days, contact our Monitor department at [phone].  If your monitor becomes loose or falls off after 4 days call [employer] at [phone] for  suggestions on securing your monitor    Follow-Up: At [facility], you and your health needs are our priority.  As part of our continuing mission to provide you with exceptional  heart care, we have created designated Provider Care Teams.  These Care Teams include your primary Cardiologist (physician) and Advanced Practice Providers (APPs -  Physician Assistants and Nurse Practitioners) who all work together to provide you with the care you need, when you need it.  We recommend signing up for the patient portal called "MyChart".  Sign up information is provided on this After Visit Summary.  MyChart is used to connect with patients for Virtual Visits (Telemedicine).  Patients are able to view lab/test results, encounter notes, upcoming appointments, etc.  Non-urgent messages can be sent to your provider as well.   To learn more about what you can do with MyChart, go to [email or web address].    Your next appointment:   6 month(s)  The format for your next appointment:   In Person  Provider:   Oswaldo Milian, MD

## 2021-12-03 DIAGNOSIS — R002 Palpitations: Secondary | ICD-10-CM | POA: Diagnosis not present

## 2021-12-05 ENCOUNTER — Other Ambulatory Visit: Payer: Self-pay | Admitting: Family Medicine

## 2021-12-05 DIAGNOSIS — E2839 Other primary ovarian failure: Secondary | ICD-10-CM

## 2022-01-08 ENCOUNTER — Inpatient Hospital Stay: Admission: RE | Admit: 2022-01-08 | Payer: BC Managed Care – PPO | Source: Ambulatory Visit

## 2022-01-20 ENCOUNTER — Other Ambulatory Visit: Payer: Self-pay

## 2022-01-20 ENCOUNTER — Ambulatory Visit (INDEPENDENT_AMBULATORY_CARE_PROVIDER_SITE_OTHER)
Admission: RE | Admit: 2022-01-20 | Discharge: 2022-01-20 | Disposition: A | Discharge status: Home / Self Care | Payer: Self-pay | Source: Ambulatory Visit | Attending: Cardiology | Admitting: Cardiology

## 2022-01-20 DIAGNOSIS — E785 Hyperlipidemia, unspecified: Secondary | ICD-10-CM

## 2022-02-04 ENCOUNTER — Other Ambulatory Visit: Payer: Self-pay | Admitting: *Deleted

## 2022-02-04 MED ORDER — ROSUVASTATIN CALCIUM 5 MG PO TABS: 5.0000 mg | ORAL_TABLET | Freq: Every day | ORAL | 3 refills | Status: AC

## 2022-03-17 ENCOUNTER — Ambulatory Visit
Admission: RE | Admit: 2022-03-17 | Discharge: 2022-03-17 | Disposition: A | Discharge status: Home / Self Care | Payer: BC Managed Care – PPO | Source: Ambulatory Visit | Attending: Family Medicine | Admitting: Family Medicine

## 2022-03-17 DIAGNOSIS — Z1231 Encounter for screening mammogram for malignant neoplasm of breast: Secondary | ICD-10-CM

## 2022-05-23 ENCOUNTER — Other Ambulatory Visit: Payer: BC Managed Care – PPO

## 2022-08-25 ENCOUNTER — Ambulatory Visit
Admission: RE | Admit: 2022-08-25 | Discharge: 2022-08-25 | Disposition: A | Discharge status: Home / Self Care | Payer: BC Managed Care – PPO | Source: Ambulatory Visit | Attending: Family Medicine | Admitting: Family Medicine

## 2022-08-25 DIAGNOSIS — E2839 Other primary ovarian failure: Secondary | ICD-10-CM

## 2024-01-01 IMAGING — MG MM DIGITAL SCREENING BILAT W/ TOMO AND CAD
6 of 10 series · 6 of 30 positions shown · non-contrast
Comparison: Previous exam(s).

CLINICAL DATA: Screening.

EXAM:
DIGITAL SCREENING BILATERAL MAMMOGRAM WITH TOMOSYNTHESIS AND CAD
TECHNIQUE: Bilateral screening digital craniocaudal and mediolateral oblique
mammograms were obtained. Bilateral screening digital breast
tomosynthesis was performed. The images were evaluated with
computer-aided detection.

[R MLO synth-2D]
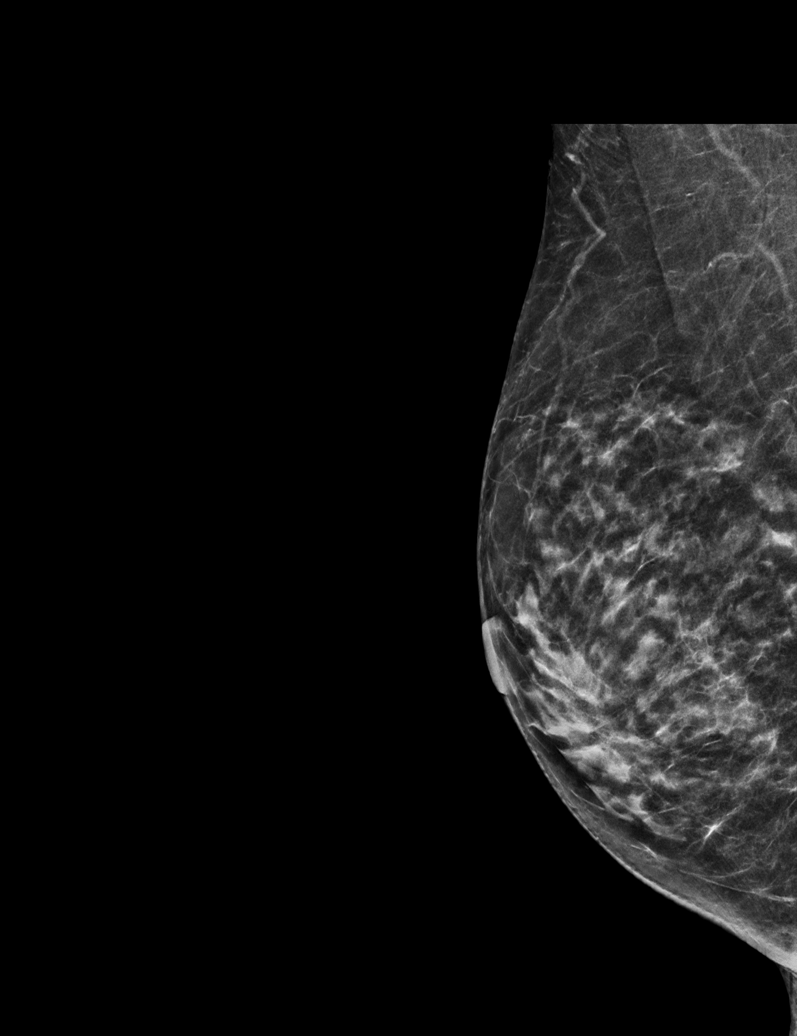

[R CC synth-2D (1 of 2)]
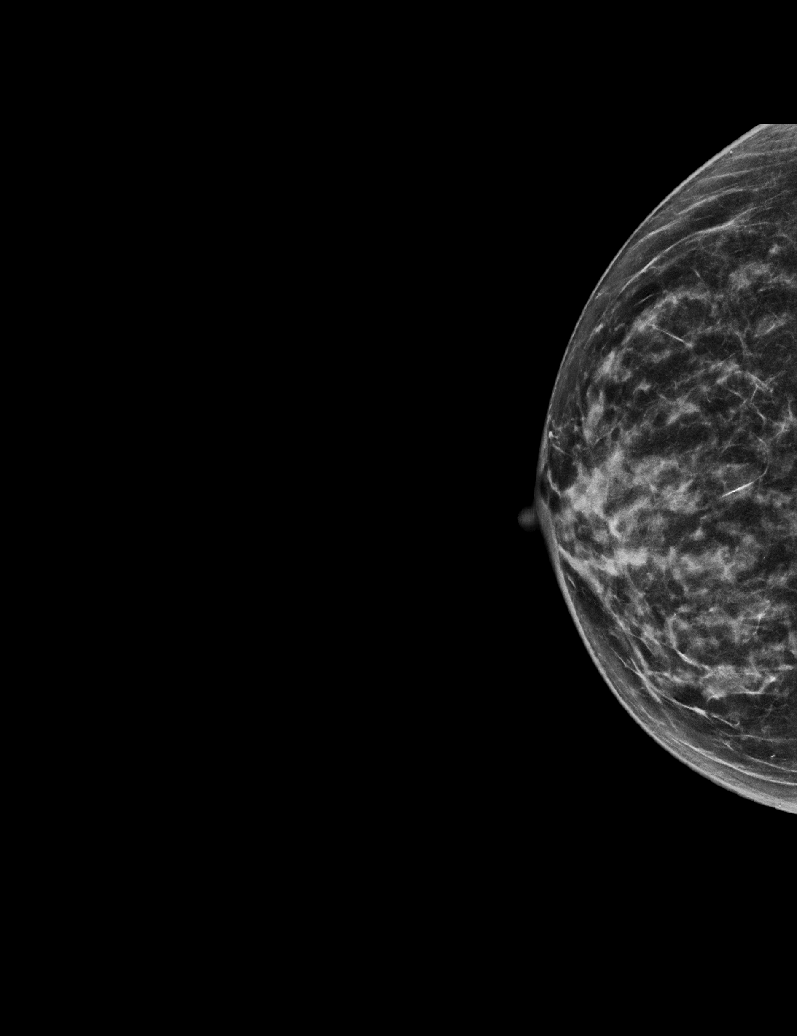

[L CC synth-2D]
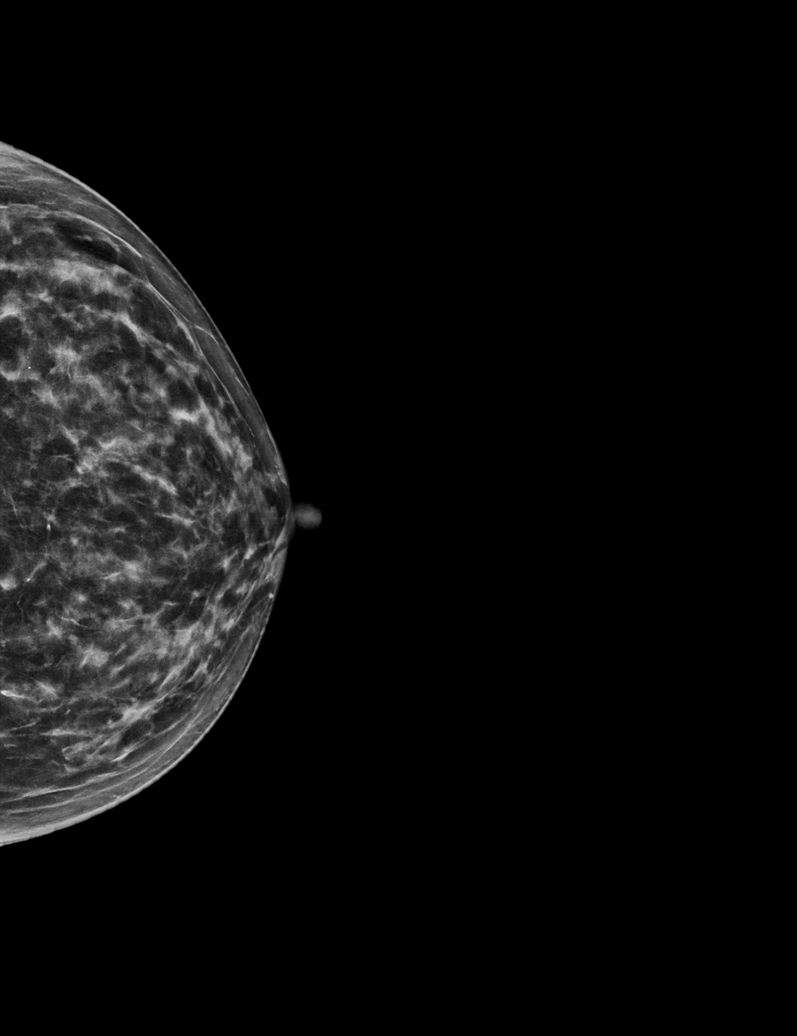

[L MLO synth-2D]
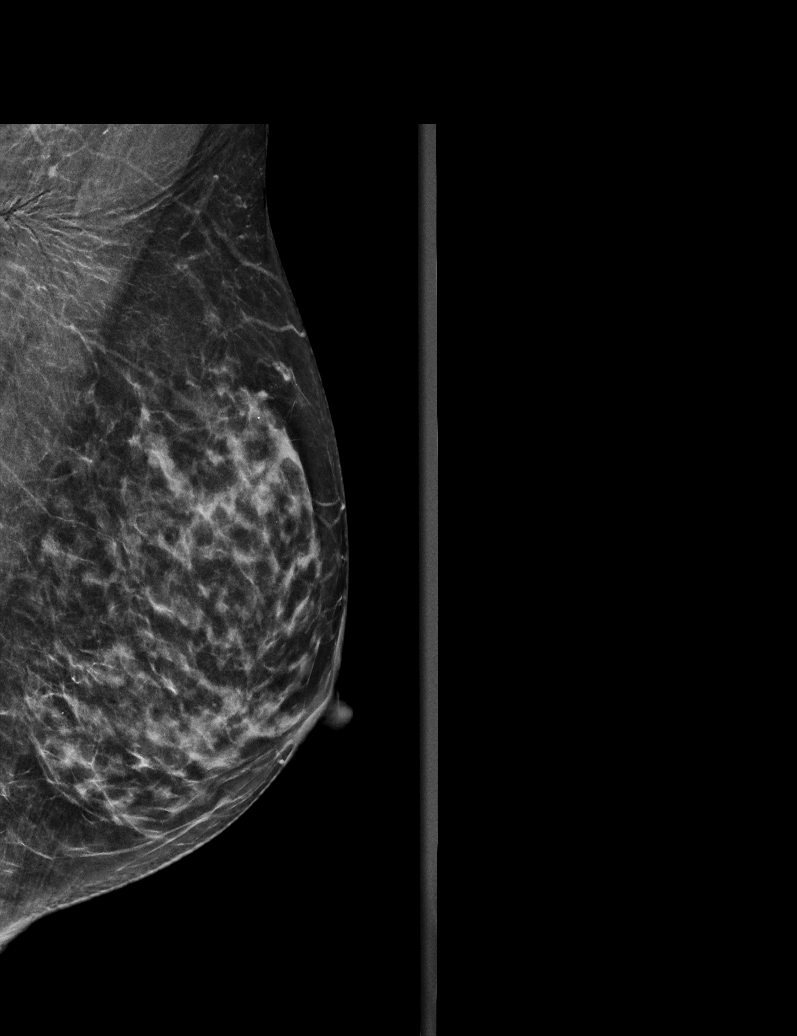

[R CC synth-2D (2 of 2)]
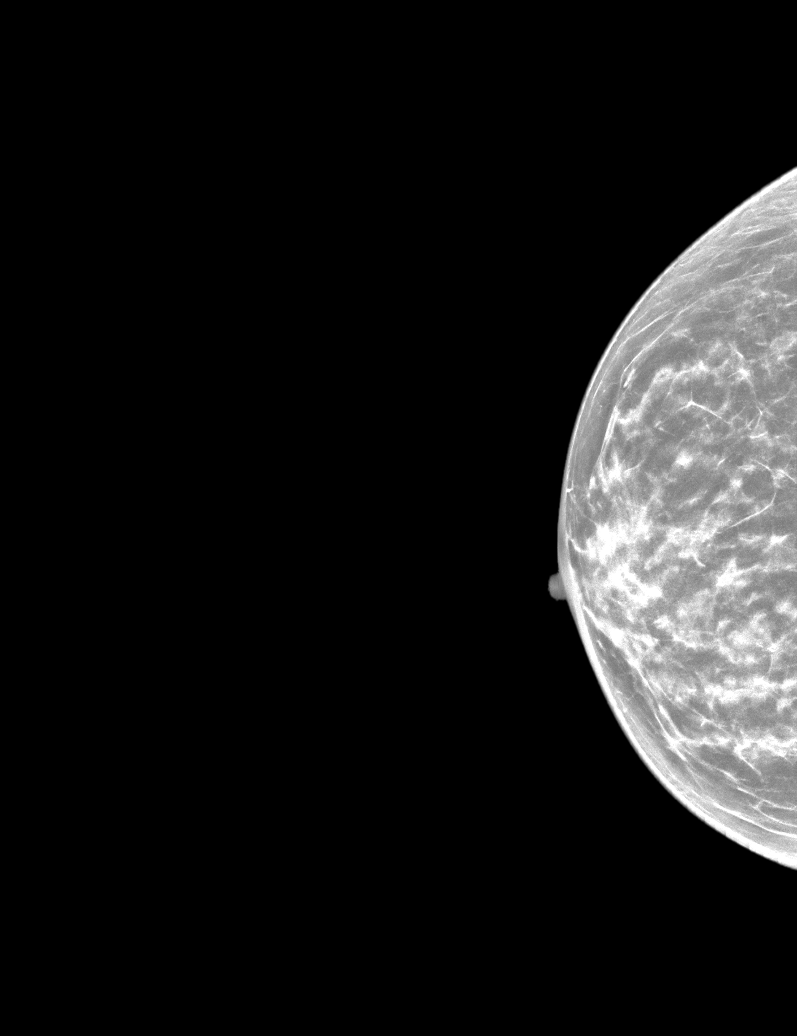

[R CC tomo · tomo slice 27/54.0]
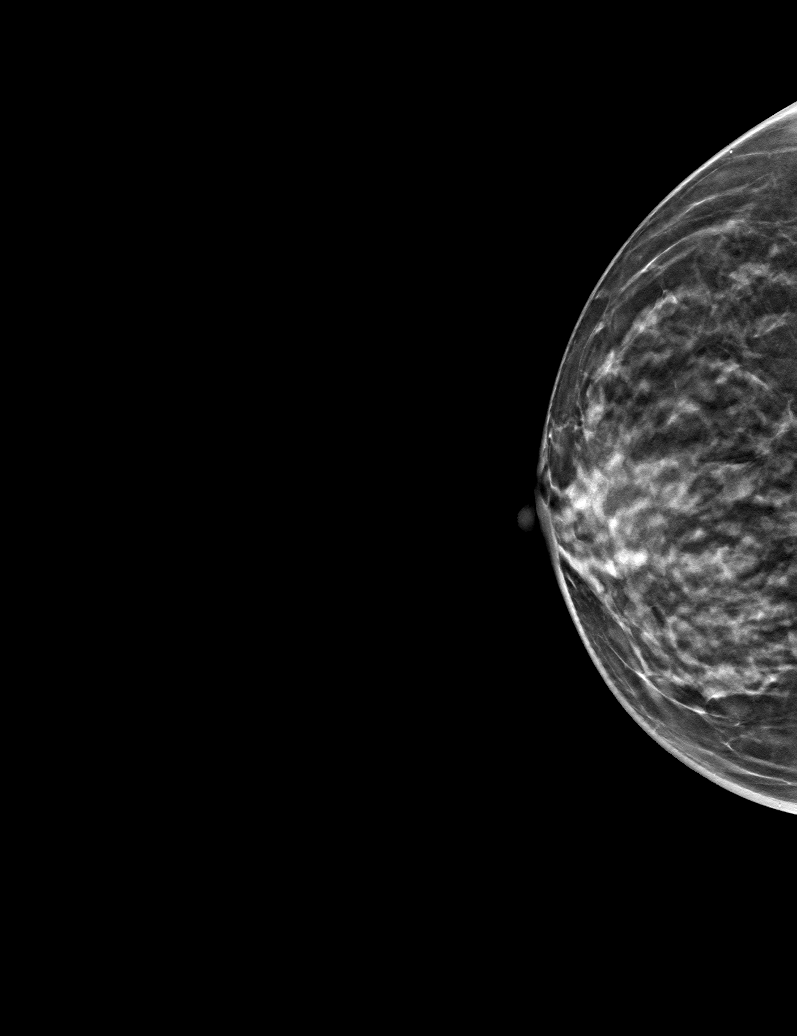

[6 of 30 positions shown; findings below may reference images not displayed]

ACR Breast Density Category c: The breast tissue is heterogeneously
dense, which may obscure small masses.
FINDINGS: There are no findings suspicious for malignancy.
IMPRESSION: No mammographic evidence of malignancy. A result letter of this
screening mammogram will be mailed directly to the patient.

RECOMMENDATION:
Screening mammogram in one year. (Code:Q3-W-BC3)

BI-RADS CATEGORY  1: Negative.

## 2024-01-25 ENCOUNTER — Encounter: Payer: Self-pay | Admitting: Physical Therapy

## 2024-01-25 ENCOUNTER — Other Ambulatory Visit: Payer: Self-pay

## 2024-01-25 ENCOUNTER — Ambulatory Visit: Payer: Self-pay | Attending: Internal Medicine | Admitting: Physical Therapy

## 2024-01-25 DIAGNOSIS — M6281 Muscle weakness (generalized): Secondary | ICD-10-CM | POA: Insufficient documentation

## 2024-01-25 NOTE — Therapy (Signed)
 OUTPATIENT PHYSICAL THERAPY EVALUATION   Patient Name: Brandy Doyle MRN: [id number] DOB:[birth year], 73 y.o., female Today's Date: 01/25/2024  END OF SESSION:  PT End of Session - 01/25/24 1102     Visit Number 1    Number of Visits 9    Date for PT Re-Evaluation 03/21/24    Authorization Type aetna    PT Start Time 1103    PT Stop Time 1143    PT Time Calculation (min) 40 min    Activity Tolerance Patient tolerated treatment well             Past Medical History:  Diagnosis Date   Diabetes mellitus without complication (HCC)    Hyperlipemia    Vitamin D deficiency    Vitreous detachment of left eye    History reviewed. No pertinent surgical history. Patient Active Problem List   Diagnosis Date Noted   TIA (transient ischemic attack) 10/26/2015   HLD (hyperlipidemia) 10/26/2015   Type 2 diabetes mellitus with circulatory disorder (HCC) 10/26/2015    PCP: Shon Hale, MD  REFERRING PROVIDER: Talmage Coin, MD  REFERRING DIAG: M81.0 (ICD-10-CM) - Age-related osteoporosis without current pathological fracture  Rationale for Evaluation and Treatment: Rehabilitation  THERAPY DIAG:  Muscle weakness (generalized)  ONSET DATE: NA  SUBJECTIVE:                                                                                                                                                                                           SUBJECTIVE STATEMENT: Pt endorses history of some weakness and falls, but generally fairly active with her job and denies any overt limitations in daily activities. States she was diagnosed with osteoporosis and sent to PT to work on activity modification and strengthening. She endorses some chronic R knee pain but generally not limiting. She also states she was advised at one point to avoid heavy lifting during to an incident several years ago in which she developed bleeding in her eye.    PERTINENT HISTORY:  DM, osteoporosis Self  reports some arrhythmia but well managed  PAIN:  None, occasional R knee pain chronic   PRECAUTIONS: osteoporosis   WEIGHT BEARING RESTRICTIONS: No  FALLS:  Has patient fallen in last 6 months? Does endorse a fall last year while at [place], passed out. Denies any issues with balance or falls in past six months   LIVING ENVIRONMENT: Lives w/ husband, has two level home with multiple stairs. 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  DIAGNOSTIC FINDINGS:  T score -4.1 per paper referral  PATIENT SURVEYS:  Deferred given reported lack of limitation  COGNITION: Overall cognitive status: Within functional limits for tasks assessed     SENSATION: No sensory complaints endorsed   POSTURE: mild forward head, rounded shoulders    RANGE OF MOTION:     Active  Right eval Left eval  Shoulder flexion full full  Functional ER combo full full  Functional IR combo    Knee extension    Ankle dorsiflexion     (Blank rows = not tested) (Key: WFL = within functional limits not formally assessed, * = concordant pain, s = stiffness/stretching sensation, NT = not tested)  Comments:    STRENGTH TESTING:  MMT Right eval Left eval  Shoulder flexion 4 4  Shoulder abduction 4 4  Elbow flexion 4 4  Elbow extension 4 4  Grip strength  40# 40#  Hip flexion 4 4-  Hip abduction (modified sitting) 4 4  Knee flexion 4- 4-  Knee extension 4- 4-  Ankle dorsiflexion    Ankle plantarflexion     (Blank rows = not tested) (Key: WFL = within functional limits not formally assessed, * = concordant pain, s =  stiffness/stretching sensation, NT = not tested)  Comments:    FUNCTIONAL TESTS:  5xSTS: 10.29sec no UE support  GAIT: Distance walked: within clinic Assistive device utilized: None Level of assistance: Complete Independence Comments: WNL  TREATMENT DATE:  [facility] Adult PT Treatment:                                                DATE: 01/25/24 Therapeutic Exercise: Red band row x10 STS x5 Standing slow march x10 HEP handout + education on strategies for safe/appropriate performance at home  Self Care: Education re: activity modification as needed, appropriate posture, fall risk reduction, rationale for interventions and relevant anatomy/physiology, introductory lifting/bending modifications in context of osteoporosis,discussed her current exercise routine and suggested modest modifications in context of osteoporosis                                                                                                               PATIENT EDUCATION:  Education details: Pt education on PT impairments, prognosis, and POC. Informed consent. Rationale for interventions, safe/appropriate HEP performance, posture, activity modification Person educated: Patient Education method: Explanation, Demonstration, Tactile cues, Verbal cues Education comprehension: verbalized understanding, returned demonstration, verbal cues required, tactile cues required, and needs further education    HOME EXERCISE PROGRAM: Access Code: [email or web address] URL: [email or web address] Date: 01/25/2024 Prepared by: Fransisco Hertz  Exercises - Standing Shoulder Row with Anchored Resistance  - 2-3 x daily - 1 sets - 5-10  reps - Sit to Stand  - 2-3 x daily - 1 sets - 5-10 reps - Standing March with Counter Support  - 2-3 x daily - 1 sets - 8-10 reps  ASSESSMENT:  CLINICAL IMPRESSION: Patient is a pleasant 73 y.o. woman who was seen today for physical therapy evaluation and treatment for osteoporosis. Pt denies any  overt limitations and states her goals are to increase strength and learn how to manage independently. On exam she does demonstrate mild weakness throughout UE/LE that would likely benefit from skilled PT, mild postural deficits. Introductory strengthening program is prescribed and pt tolerates well in clinic, introductory education is provided on basic postural/activity modifications in context of osteoporosis. No adverse events. Recommend skilled PT to address aforementioned deficits with aim of improving functional tolerance and maximizing pt self efficacy with management of condition. Pt departs today's session in no acute distress, all voiced concerns/questions addressed appropriately from PT perspective.      OBJECTIVE IMPAIRMENTS: decreased strength and postural dysfunction.   ACTIVITY LIMITATIONS: bending and squatting  PARTICIPATION LIMITATIONS:  denies limitations  PERSONAL FACTORS: Age, Time since onset of injury/illness/exacerbation, and 1-2 comorbidities: DM, TIA  are also affecting patient's functional outcome.   REHAB POTENTIAL: Good  CLINICAL DECISION MAKING: Stable/uncomplicated  EVALUATION COMPLEXITY: Low   GOALS:   SHORT TERM GOALS: Target date: 02/22/2024  Pt will demonstrate appropriate understanding and performance of initially prescribed HEP in order to facilitate improved independence with management of symptoms.  Baseline: HEP established  Goal status: INITIAL   2. Pt will report at least 10% improvement in grip strength in order to facilitate improved functional strength.  Baseline: see MMT chart above  Goal status: INITIAL    LONG TERM GOALS: Target date: 03/21/2024   Pt will demonstrate ability to safely lift at least 10# with hinge pattern in order to facilitate improved safety w/ household tasks such as groceries.  Baseline: limiting heavy lifting, tendency towards spine flexion w/ bending Goal status: INITIAL  2.  Pt will demonstrate global MMT  improvement of at least 1 grade in tested muscles in order to facilitate improved functional strength.  Baseline: see MMT chart above  Goal status: INITIAL    3. Pt will perform 5xSTS in <8 sec in order to demonstrate reduced fall risk and improved functional independence. (MCID of 2.3sec)  Baseline: 10sec w/o UE support  Goal status: INITIAL   4. Pt will demonstrate appropriate performance of final prescribed HEP in order to facilitate improved self-management of symptoms post-discharge.   Baseline: initial HEP prescribed  Goal status: INITIAL     PLAN:  PT FREQUENCY: 1x/week  PT DURATION: 8 weeks  PLANNED INTERVENTIONS: 97164- PT Re-evaluation, 97110-Therapeutic exercises, 97530- Therapeutic activity, 97112- Neuromuscular re-education, 97535- Self Care, 27253- Manual therapy, [phone]- Gait training, Patient/Family education, Balance training, Stair training, Taping, Cryotherapy, and Moist heat.  PLAN FOR NEXT SESSION: Review/update HEP PRN. Work on [employer] exercises as appropriate with emphasis on functional strengthening, hinging mechanics. May benefit from higher level balance assessment. Symptom modification strategies as indicated/appropriate.    Ashley Murrain PT, DPT 01/25/2024 3:55 PM

## 2024-02-03 ENCOUNTER — Encounter: Payer: Self-pay | Admitting: Physical Therapy

## 2024-02-03 ENCOUNTER — Ambulatory Visit: Admitting: Physical Therapy

## 2024-02-03 DIAGNOSIS — M6281 Muscle weakness (generalized): Secondary | ICD-10-CM

## 2024-02-03 NOTE — Therapy (Signed)
 OUTPATIENT PHYSICAL THERAPY TREATMENT   Patient Name: Brandy Doyle MRN: [id number] DOB:Aug 29, 1951, 73 y.o., female Today's Date: 02/03/2024  END OF SESSION:  PT End of Session - 02/03/24 1100     Visit Number 2    Number of Visits 9    Date for PT Re-Evaluation 03/21/24    Authorization Type aetna    PT Start Time 1101    PT Stop Time 1146    PT Time Calculation (min) 45 min    Activity Tolerance Patient tolerated treatment well              Past Medical History:  Diagnosis Date   Diabetes mellitus without complication (HCC)    Hyperlipemia    Vitamin D deficiency    Vitreous detachment of left eye    History reviewed. No pertinent surgical history. Patient Active Problem List   Diagnosis Date Noted   TIA (transient ischemic attack) 10/26/2015   HLD (hyperlipidemia) 10/26/2015   Type 2 diabetes mellitus with circulatory disorder (HCC) 10/26/2015    PCP: Shon Hale, MD  REFERRING PROVIDER: Talmage Coin, MD  REFERRING DIAG: M81.0 (ICD-10-CM) - Age-related osteoporosis without current pathological fracture  Rationale for Evaluation and Treatment: Rehabilitation  THERAPY DIAG:  Muscle weakness (generalized)  ONSET DATE: NA  SUBJECTIVE:                                                                                                                                                                                          Per eval - Pt endorses history of some weakness and falls, but generally fairly active with her job and denies any overt limitations in daily activities. States she was diagnosed with osteoporosis and sent to PT to work on activity modification and strengthening. She endorses some chronic R knee pain but generally not limiting. She also states she was advised at one point to avoid heavy lifting during to an incident several years ago in which she developed bleeding in her eye.   SUBJECTIVE STATEMENT: 02/03/2024 No issues after last session,  has been doing HEP daily. Feels comfortable, feels she can already feel a difference in her arm strength.   PERTINENT HISTORY:  DM, osteoporosis Self reports some arrhythmia but well managed  PAIN:  None, occasional R knee pain chronic   PRECAUTIONS: osteoporosis   WEIGHT BEARING RESTRICTIONS: No  FALLS:  Has patient fallen in last 6 months? Does endorse a fall last year while at [place], passed out. Denies any issues with balance or falls in past six months   LIVING ENVIRONMENT: Lives w/ husband, has two level home with multiple stairs. 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  DIAGNOSTIC FINDINGS:  T score -4.1 per paper referral  PATIENT SURVEYS:  Deferred given reported lack of limitation  COGNITION: Overall cognitive status: Within functional limits for tasks assessed     SENSATION: No sensory complaints endorsed   POSTURE: mild forward head, rounded shoulders    RANGE OF MOTION:     Active  Right eval Left eval  Shoulder flexion full full  Functional ER combo full full  Functional IR combo    Knee extension    Ankle dorsiflexion     (Blank rows = not tested) (Key: WFL = within functional limits not formally assessed, * = concordant pain, s = stiffness/stretching sensation, NT = not tested)  Comments:    STRENGTH TESTING:  MMT Right eval Left eval  Shoulder flexion 4 4  Shoulder abduction 4 4  Elbow flexion 4 4  Elbow extension 4 4  Grip strength  40# 40#  Hip flexion 4 4-  Hip abduction (modified sitting) 4 4  Knee flexion 4- 4-  Knee extension 4- 4-  Ankle dorsiflexion     Ankle plantarflexion     (Blank rows = not tested) (Key: WFL = within functional limits not formally assessed, * = concordant pain, s = stiffness/stretching sensation, NT = not tested)  Comments:    FUNCTIONAL TESTS:  5xSTS: 10.29sec no UE support  GAIT: Distance walked: within clinic Assistive device utilized: None Level of assistance: Complete Independence Comments: WNL  TREATMENT DATE:  [facility] Adult PT Treatment:                                                DATE: 02/03/24 Therapeutic Exercise: Red band row x15 cues for posture and scapular mechanics  Green band row x15 Red band paloff press x15 BIL cues for posture HEP update + education/handout  Therapeutic Activity: STS 2x10 BW cues for pacing 4 inch fwd step up no UE support x10 each LE  8 inch step up LUE support x8 BIL BW hinge at wall x12 cues for posture and trunk mechanics Continued education re: postural modifications    [facility] Adult PT Treatment:                                                DATE: 01/25/24 Therapeutic Exercise: Red band row x10 STS x5 Standing slow march x10 HEP handout + education on strategies for safe/appropriate performance at home  Self Care: Education re: activity modification as needed, appropriate posture, fall risk reduction, rationale for interventions and relevant anatomy/physiology, introductory lifting/bending modifications in context of osteoporosis,discussed her current exercise routine and suggested modest modifications in context of osteoporosis  PATIENT EDUCATION:  Education details: rationale for interventions, HEP  Person educated: Patient Education method: Explanation, Demonstration, Tactile cues, Verbal cues Education comprehension: verbalized understanding, returned demonstration, verbal cues required, tactile cues required, and needs further education      HOME EXERCISE PROGRAM: Access Code: [email or web address] URL: [email or web address] Date: 02/03/2024 Prepared by: Fransisco Hertz  Program Notes - with step ups, please perform at bottom of stairs and use rail as needed for balance  Exercises - Standing Shoulder Row with Anchored Resistance  - 2-3 x daily - 1 sets - 10 reps - Sit to Stand  - 2-3 x daily - 1 sets - 5-10 reps - Standing March with Counter Support  - 2-3 x daily - 1 sets - 8-10 reps - Forward Step Up  - 2-3 x daily - 1 sets - 8 reps  ASSESSMENT:  CLINICAL IMPRESSION: 02/03/2024 Pt arrives w/o pain, reports good HEP adherence and no issues after eval. Today focusing building exercise program that emphasizes compound movements and core/periscapular strengthening. No adverse events, tolerates well, requires cues for posture throughout. Recommend continuing along current POC in order to address relevant deficits and improve functional tolerance. Pt departs today's session in no acute distress, all voiced questions/concerns addressed appropriately from PT perspective.    Per eval - Patient is a pleasant 73 y.o. woman who was seen today for physical therapy evaluation and treatment for osteoporosis. Pt denies any overt limitations and states her goals are to increase strength and learn how to manage independently. On exam she does demonstrate mild weakness throughout UE/LE that would likely benefit from skilled PT, mild postural deficits. Introductory strengthening program is prescribed and pt tolerates well in clinic, introductory education is provided on basic postural/activity modifications in context of osteoporosis. No adverse events. Recommend skilled PT to address aforementioned deficits with aim of improving functional tolerance and maximizing pt self efficacy with management of condition. Pt departs today's session in no acute distress, all voiced concerns/questions addressed appropriately from PT perspective.      OBJECTIVE  IMPAIRMENTS: decreased strength and postural dysfunction.   ACTIVITY LIMITATIONS: bending and squatting  PARTICIPATION LIMITATIONS:  denies limitations  PERSONAL FACTORS: Age, Time since onset of injury/illness/exacerbation, and 1-2 comorbidities: DM, TIA  are also affecting patient's functional outcome.   REHAB POTENTIAL: Good  CLINICAL DECISION MAKING: Stable/uncomplicated  EVALUATION COMPLEXITY: Low   GOALS:   SHORT TERM GOALS: Target date: 02/22/2024  Pt will demonstrate appropriate understanding and performance of initially prescribed HEP in order to facilitate improved independence with management of symptoms.  Baseline: HEP established  Goal status: INITIAL   2. Pt will report at least 10% improvement in grip strength in order to facilitate improved functional strength.  Baseline: see MMT chart above  Goal status: INITIAL    LONG TERM GOALS: Target date: 03/21/2024   Pt will demonstrate ability to safely lift at least 10# with hinge pattern in order to facilitate improved safety w/ household tasks such as groceries.  Baseline: limiting heavy lifting, tendency towards spine flexion w/ bending Goal status: INITIAL  2.  Pt will demonstrate global MMT improvement of at least 1 grade in tested muscles in order to facilitate improved functional strength.  Baseline: see MMT chart above  Goal status: INITIAL    3. Pt will perform 5xSTS in <8 sec in order to demonstrate reduced fall risk and improved functional independence. (MCID of 2.3sec)  Baseline: 10sec w/o UE support  Goal status: INITIAL   4. Pt will demonstrate appropriate  performance of final prescribed HEP in order to facilitate improved self-management of symptoms post-discharge.   Baseline: initial HEP prescribed  Goal status: INITIAL     PLAN:  PT FREQUENCY: 1x/week  PT DURATION: 8 weeks  PLANNED INTERVENTIONS: 97164- PT Re-evaluation, 97110-Therapeutic exercises, 97530- Therapeutic activity, 97112-  Neuromuscular re-education, 97535- Self Care, 14782- Manual therapy, [phone]- Gait training, Patient/Family education, Balance training, Stair training, Taping, Cryotherapy, and Moist heat.  PLAN FOR NEXT SESSION: Review/update HEP PRN. Work on [employer] exercises as appropriate with emphasis on functional strengthening, hinging mechanics. May benefit from higher level balance assessment. Symptom modification strategies as indicated/appropriate.    Ashley Murrain PT, DPT 02/03/2024 11:49 AM

## 2024-02-05 NOTE — Therapy (Signed)
 OUTPATIENT PHYSICAL THERAPY TREATMENT   Patient Name: Brandy Doyle MRN: [id number] DOB:1950/11/26, 73 y.o., female Today's Date: 02/08/2024  END OF SESSION:  PT End of Session - 02/08/24 1447     Visit Number 3    Number of Visits 9    Date for PT Re-Evaluation 03/21/24    Authorization Type aetna    PT Start Time 1447    PT Stop Time 1530    PT Time Calculation (min) 43 min    Activity Tolerance Patient tolerated treatment well               Past Medical History:  Diagnosis Date   Diabetes mellitus without complication (HCC)    Hyperlipemia    Vitamin D deficiency    Vitreous detachment of left eye    History reviewed. No pertinent surgical history. Patient Active Problem List   Diagnosis Date Noted   TIA (transient ischemic attack) 10/26/2015   HLD (hyperlipidemia) 10/26/2015   Type 2 diabetes mellitus with circulatory disorder (HCC) 10/26/2015    PCP: Shon Hale, MD  REFERRING PROVIDER: Talmage Coin, MD  REFERRING DIAG: M81.0 (ICD-10-CM) - Age-related osteoporosis without current pathological fracture  Rationale for Evaluation and Treatment: Rehabilitation  THERAPY DIAG:  Muscle weakness (generalized)  ONSET DATE: NA  SUBJECTIVE:                                                                                                                                                                                          Per eval - Pt endorses history of some weakness and falls, but generally fairly active with her job and denies any overt limitations in daily activities. States she was diagnosed with osteoporosis and sent to PT to work on activity modification and strengthening. She endorses some chronic R knee pain but generally not limiting. She also states she was advised at one point to avoid heavy lifting during to an incident several years ago in which she developed bleeding in her eye.   SUBJECTIVE STATEMENT: 02/08/2024 no issues after last session.  Wants to strengthen her back.    PERTINENT HISTORY:  DM, osteoporosis Self reports some arrhythmia but well managed  PAIN:  None, occasional R knee pain chronic   PRECAUTIONS: osteoporosis   WEIGHT BEARING RESTRICTIONS: No  FALLS:  Has patient fallen in last 6 months? Does endorse a fall last year while at [place], passed out. Denies any issues with balance or falls in past six months   LIVING ENVIRONMENT: Lives w/ husband, has two level home with multiple stairs. 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  DIAGNOSTIC FINDINGS:  T score -4.1 per paper referral  PATIENT SURVEYS:  Deferred given reported lack of limitation  COGNITION: Overall cognitive status: Within functional limits for tasks assessed     SENSATION: No sensory complaints endorsed   POSTURE: mild forward head, rounded shoulders    RANGE OF MOTION:     Active  Right eval Left eval  Shoulder flexion full full  Functional ER combo full full  Functional IR combo    Knee extension    Ankle dorsiflexion     (Blank rows = not tested) (Key: WFL = within functional limits not formally assessed, * = concordant pain, s = stiffness/stretching sensation, NT = not tested)  Comments:    STRENGTH TESTING:  MMT Right eval Left eval  Shoulder flexion 4 4  Shoulder abduction 4 4  Elbow flexion 4 4  Elbow extension 4 4  Grip strength  40# 40#  Hip flexion 4 4-  Hip abduction (modified sitting) 4 4  Knee flexion 4- 4-  Knee extension 4- 4-  Ankle dorsiflexion    Ankle plantarflexion     (Blank rows = not tested) (Key: WFL = within  functional limits not formally assessed, * = concordant pain, s = stiffness/stretching sensation, NT = not tested)  Comments:    FUNCTIONAL TESTS:  5xSTS: 10.29sec no UE support  GAIT: Distance walked: within clinic Assistive device utilized: None Level of assistance: Complete Independence Comments: WNL  TREATMENT DATE:  [facility] Adult PT Treatment:                                                DATE: 02/08/24 Therapeutic Exercise: Bridge 2x10 cues for breath control and comfortable ROM Standing hip 45 deg kickback 2x5 red band cues for posture and ROM Green band row 2x12 cues for posture Green band paloff press 2x8 BIL cues for posture HEP update + education/handout  Therapeutic Activity: STS x15 BW, 2# 2x8 cues for pacing and form  BW hinge at wall 2x10 cues for increased ROM, appropriate trunk mechanics and reduced thoracic kyphosis   [facility] Adult PT Treatment:                                                DATE: 02/03/24 Therapeutic Exercise: Red band row x15 cues for posture and scapular mechanics  Green band row x15 Red band paloff press x15 BIL cues for posture HEP update + education/handout  Therapeutic Activity: STS 2x10 BW cues for pacing 4 inch fwd step up no UE support x10 each LE  8 inch step up LUE support x8 BIL BW hinge at wall x12 cues for posture and trunk mechanics Continued education re: postural modifications    [facility] Adult PT Treatment:  DATE: 01/25/24 Therapeutic Exercise: Red band row x10 STS x5 Standing slow march x10 HEP handout + education on strategies for safe/appropriate performance at home  Self Care: Education re: activity modification as needed, appropriate posture, fall risk reduction, rationale for interventions and relevant anatomy/physiology, introductory lifting/bending modifications in context of osteoporosis,discussed her current exercise routine and suggested modest modifications in context  of osteoporosis                                                                                                               PATIENT EDUCATION:  Education details: rationale for interventions, HEP  Person educated: Patient Education method: Explanation, Demonstration, Tactile cues, Verbal cues Education comprehension: verbalized understanding, returned demonstration, verbal cues required, tactile cues required, and needs further education     HOME EXERCISE PROGRAM: Access Code: [email or web address] URL: [email or web address] Date: 02/08/2024 Prepared by: Fransisco Hertz  Program Notes - for program, please choose 2-3 exercises per day, and alternate exercises on different days- with step ups, please perform at bottom of stairs and use rail as needed for balance- can use 2lb weight for sit to stands as done in clinic  Exercises - Standing Shoulder Row with Anchored Resistance  - 3-4 x weekly - 2-3 sets - 10 reps - Sit to Stand  - 3-4 x weekly - 2-3 sets - 5-10 reps - Standing March with Counter Support  - 3-4 x weekly - 2-3 sets - 8-10 reps - Forward Step Up  - 3-4 x weekly - 2-3 sets - 8 reps - Standing Hip Extension with Resistance at Ankles and Counter Support  - 3-4 x weekly - 2-3 sets - 5 reps - Supine Bridge  - 3-4 x weekly - 2-3 sets - 8 reps - Standing Anti-Rotation Press with Anchored Resistance  - 3-4 x weekly - 2-3 sets - 10 reps  ASSESSMENT:  CLINICAL IMPRESSION: 02/08/2024 Pt arrives w/o complaint, no issues after last session. She is inquisitive about strengthening her back/hips and we increase volume for these areas today. Also able to tolerate gentle progression for resistance w/ STS, increased volume with hinges although still requires intermittent cues for trunk mechanics. No adverse events, pt tolerates session quite well. Recommend continuing along current POC in order to address relevant deficits and improve functional tolerance.Pt departs today's session in no  acute distress, all voiced questions/concerns addressed appropriately from PT perspective.    Per eval - Patient is a pleasant 73 y.o. woman who was seen today for physical therapy evaluation and treatment for osteoporosis. Pt denies any overt limitations and states her goals are to increase strength and learn how to manage independently. On exam she does demonstrate mild weakness throughout UE/LE that would likely benefit from skilled PT, mild postural deficits. Introductory strengthening program is prescribed and pt tolerates well in clinic, introductory education is provided on basic postural/activity modifications in context of osteoporosis. No adverse events. Recommend skilled PT to address aforementioned deficits with aim of improving functional tolerance and maximizing pt self efficacy with management of condition. Pt  departs today's session in no acute distress, all voiced concerns/questions addressed appropriately from PT perspective.      OBJECTIVE IMPAIRMENTS: decreased strength and postural dysfunction.   ACTIVITY LIMITATIONS: bending and squatting  PARTICIPATION LIMITATIONS:  denies limitations  PERSONAL FACTORS: Age, Time since onset of injury/illness/exacerbation, and 1-2 comorbidities: DM, TIA  are also affecting patient's functional outcome.   REHAB POTENTIAL: Good  CLINICAL DECISION MAKING: Stable/uncomplicated  EVALUATION COMPLEXITY: Low   GOALS:   SHORT TERM GOALS: Target date: 02/22/2024  Pt will demonstrate appropriate understanding and performance of initially prescribed HEP in order to facilitate improved independence with management of symptoms.  Baseline: HEP established  Goal status: INITIAL   2. Pt will report at least 10% improvement in grip strength in order to facilitate improved functional strength.  Baseline: see MMT chart above  Goal status: INITIAL    LONG TERM GOALS: Target date: 03/21/2024   Pt will demonstrate ability to safely lift at least 10#  with hinge pattern in order to facilitate improved safety w/ household tasks such as groceries.  Baseline: limiting heavy lifting, tendency towards spine flexion w/ bending Goal status: INITIAL  2.  Pt will demonstrate global MMT improvement of at least 1 grade in tested muscles in order to facilitate improved functional strength.  Baseline: see MMT chart above  Goal status: INITIAL    3. Pt will perform 5xSTS in <8 sec in order to demonstrate reduced fall risk and improved functional independence. (MCID of 2.3sec)  Baseline: 10sec w/o UE support  Goal status: INITIAL   4. Pt will demonstrate appropriate performance of final prescribed HEP in order to facilitate improved self-management of symptoms post-discharge.   Baseline: initial HEP prescribed  Goal status: INITIAL     PLAN:  PT FREQUENCY: 1x/week  PT DURATION: 8 weeks  PLANNED INTERVENTIONS: 97164- PT Re-evaluation, 97110-Therapeutic exercises, 97530- Therapeutic activity, 97112- Neuromuscular re-education, 97535- Self Care, [id number]- Manual therapy, [phone]- Gait training, Patient/Family education, Balance training, Stair training, Taping, Cryotherapy, and Moist heat.  PLAN FOR NEXT SESSION: Review/update HEP PRN. Work on [employer] exercises as appropriate with emphasis on functional strengthening, hinging mechanics. May benefit from higher level balance assessment. Symptom modification strategies as indicated/appropriate.    Ashley Murrain PT, DPT 02/08/2024 3:31 PM

## 2024-02-08 ENCOUNTER — Encounter: Payer: Self-pay | Admitting: Physical Therapy

## 2024-02-08 ENCOUNTER — Ambulatory Visit: Admitting: Physical Therapy

## 2024-02-08 DIAGNOSIS — M6281 Muscle weakness (generalized): Secondary | ICD-10-CM

## 2024-02-15 ENCOUNTER — Encounter: Admitting: Physical Therapy

## 2024-02-22 ENCOUNTER — Encounter: Payer: Self-pay | Admitting: Physical Therapy

## 2024-02-22 ENCOUNTER — Ambulatory Visit: Attending: Internal Medicine | Admitting: Physical Therapy

## 2024-02-22 DIAGNOSIS — M6281 Muscle weakness (generalized): Secondary | ICD-10-CM | POA: Diagnosis present

## 2024-02-22 NOTE — Therapy (Signed)
 OUTPATIENT PHYSICAL THERAPY TREATMENT   Patient Name: Brandy Doyle MRN: [id number] DOB:[birth year], 73 y.o., female Today's Date: 02/22/2024  END OF SESSION:  PT End of Session - 02/22/24 1045     Visit Number 4    Number of Visits 9    Date for PT Re-Evaluation 03/21/24    Authorization Type aetna    PT Start Time 1045    PT Stop Time 1127    PT Time Calculation (min) 42 min    Activity Tolerance Patient tolerated treatment well                Past Medical History:  Diagnosis Date   Diabetes mellitus without complication (HCC)    Hyperlipemia    Vitamin D deficiency    Vitreous detachment of left eye    History reviewed. No pertinent surgical history. Patient Active Problem List   Diagnosis Date Noted   TIA (transient ischemic attack) 10/26/2015   HLD (hyperlipidemia) 10/26/2015   Type 2 diabetes mellitus with circulatory disorder (HCC) 10/26/2015    PCP: Shon Hale, MD  REFERRING PROVIDER: Talmage Coin, MD  REFERRING DIAG: M81.0 (ICD-10-CM) - Age-related osteoporosis without current pathological fracture  Rationale for Evaluation and Treatment: Rehabilitation  THERAPY DIAG:  Muscle weakness (generalized)  ONSET DATE: NA  SUBJECTIVE:                                                                                                                                                                                          Per eval - Pt endorses history of some weakness and falls, but generally fairly active with her job and denies any overt limitations in daily activities. States she was diagnosed with osteoporosis and sent to PT to work on activity modification and strengthening. She endorses some chronic R knee pain but generally not limiting. She also states she was advised at one point to avoid heavy lifting during to an incident several years ago in which she developed bleeding in her eye.   SUBJECTIVE STATEMENT: 02/22/2024 States exercises have been  going well. Had some trouble with bridges and has omitted. States she feels stronger. otherwise no new updates    PERTINENT HISTORY:  DM, osteoporosis Self reports some arrhythmia but well managed  PAIN:  None, occasional R knee pain chronic   PRECAUTIONS: osteoporosis   WEIGHT BEARING RESTRICTIONS: No  FALLS:  Has patient fallen in last 6 months? Does endorse a fall last year while at [place], passed out. Denies any issues with balance or falls in past six months   LIVING ENVIRONMENT: Lives w/ husband, has two level home with multiple stairs. 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  DIAGNOSTIC FINDINGS:  T score -4.1 per paper referral  PATIENT SURVEYS:  Deferred given reported lack of limitation  COGNITION: Overall cognitive status: Within functional limits for tasks assessed     SENSATION: No sensory complaints endorsed   POSTURE: mild forward head, rounded shoulders    RANGE OF MOTION:     Active  Right eval Left eval  Shoulder flexion full full  Functional ER combo full full  Functional IR combo    Knee extension    Ankle dorsiflexion     (Blank rows = not tested) (Key: WFL = within functional limits not formally assessed, * = concordant pain, s = stiffness/stretching sensation, NT = not tested)  Comments:    STRENGTH TESTING:  MMT Right eval Left eval R/L 02/22/24  Shoulder flexion 4 4   Shoulder abduction 4 4   Elbow flexion 4 4   Elbow extension 4 4   Grip strength  40# 40#  45# / 40#  Hip flexion 4 4-   Hip abduction (modified sitting) 4 4   Knee flexion 4- 4-   Knee  extension 4- 4-   Ankle dorsiflexion     Ankle plantarflexion      (Blank rows = not tested) (Key: WFL = within functional limits not formally assessed, * = concordant pain, s = stiffness/stretching sensation, NT = not tested)  Comments:    FUNCTIONAL TESTS:  5xSTS: 10.29sec no UE support  GAIT: Distance walked: within clinic Assistive device utilized: None Level of assistance: Complete Independence Comments: WNL  TREATMENT DATE:  [facility] Adult PT Treatment:                                                DATE: 02/22/24 Therapeutic Exercise: Green band towel grip row x15 Blue band row towel grip x15 Blue band paloff x10, x12 BIL Bridge x8 cues for reduced arch Red band hip 45 deg kickback x10 BIL HEP update w/ blue band for rows/paloff  Therapeutic Activity: 6 inch step tap weaning UE support, 2x8 BIL emphasis on weight shifting and pacing  6 inch step up no UE support x10 BIL cues for pacing and controlled descent 5# STS 3x5    [facility] Adult PT Treatment:                                                DATE: 02/08/24 Therapeutic Exercise: Bridge 2x10 cues for breath control and comfortable ROM Standing hip 45 deg kickback 2x5 red band cues for posture and ROM Green band row 2x12 cues for posture Green band paloff press 2x8 BIL cues for posture HEP update + education/handout  Therapeutic Activity: STS x15 BW, 2# 2x8 cues for pacing and form  BW hinge at wall 2x10 cues for increased ROM, appropriate trunk mechanics and reduced thoracic kyphosis   [facility] Adult PT  Treatment:                                                DATE: 02/03/24 Therapeutic Exercise: Red band row x15 cues for posture and scapular mechanics  Green band row x15 Red band paloff press x15 BIL cues for posture HEP update + education/handout  Therapeutic Activity: STS 2x10 BW cues for pacing 4 inch fwd step up no UE support x10 each LE  8 inch step up LUE support x8 BIL BW hinge at wall x12 cues for posture  and trunk mechanics Continued education re: postural modifications                                                                                                            PATIENT EDUCATION:  Education details: rationale for interventions, HEP  Person educated: Patient Education method: Explanation, Demonstration, Tactile cues, Verbal cues Education comprehension: verbalized understanding, returned demonstration, verbal cues required, tactile cues required, and needs further education     HOME EXERCISE PROGRAM: Access Code: [email or web address] URL: [email or web address] Date: 02/22/2024 Prepared by: Mayme Spearman  Program Notes - for program, please choose 2-3 exercises per day, and alternate exercises on different days- with step ups, please perform at bottom of stairs and use rail as needed for balance- can use up to 5 lb weight for sit to stands as done in clinic, please hold weight close to chest  Exercises - Standing Shoulder Row with Anchored Resistance  - 3-4 x weekly - 2-3 sets - 10 reps - Sit to Stand  - 3-4 x weekly - 2-3 sets - 5-10 reps - Standing March with Counter Support  - 3-4 x weekly - 2-3 sets - 8-10 reps - Forward Step Up  - 3-4 x weekly - 2-3 sets - 8 reps - Standing Hip Extension with Resistance at Ankles and Counter Support  - 3-4 x weekly - 2-3 sets - 5 reps - Supine Bridge  - 3-4 x weekly - 2-3 sets - 8 reps - Standing Anti-Rotation Press with Anchored Resistance  - 3-4 x weekly - 2-3 sets - 10 reps  ASSESSMENT:  CLINICAL IMPRESSION: 02/22/2024 Pt arrives w/ report of improving strength, no issues after last session. Today we continue to progress core/periscapular strengthening as well as WB activities for LE strengthening. Emphasis on posture, stability, cues as above. No adverse events or pain with activities. Progressing well with independence for exercises, continued education throughout to maximize pt self efficacy with exercise. Recommend continuing  along current POC in order to address relevant deficits and improve functional tolerance. Pt departs today's session in no acute distress, all voiced questions/concerns addressed appropriately from PT perspective.     Per eval - Patient is a pleasant 73 y.o. woman who was seen today for physical therapy evaluation and treatment for osteoporosis. Pt denies any overt limitations and states her goals are to increase strength and learn  how to manage independently. On exam she does demonstrate mild weakness throughout UE/LE that would likely benefit from skilled PT, mild postural deficits. Introductory strengthening program is prescribed and pt tolerates well in clinic, introductory education is provided on basic postural/activity modifications in context of osteoporosis. No adverse events. Recommend skilled PT to address aforementioned deficits with aim of improving functional tolerance and maximizing pt self efficacy with management of condition. Pt departs today's session in no acute distress, all voiced concerns/questions addressed appropriately from PT perspective.      OBJECTIVE IMPAIRMENTS: decreased strength and postural dysfunction.   ACTIVITY LIMITATIONS: bending and squatting  PARTICIPATION LIMITATIONS:  denies limitations  PERSONAL FACTORS: Age, Time since onset of injury/illness/exacerbation, and 1-2 comorbidities: DM, TIA  are also affecting patient's functional outcome.   REHAB POTENTIAL: Good  CLINICAL DECISION MAKING: Stable/uncomplicated  EVALUATION COMPLEXITY: Low   GOALS:   SHORT TERM GOALS: Target date: 02/22/2024  Pt will demonstrate appropriate understanding and performance of initially prescribed HEP in order to facilitate improved independence with management of symptoms.  Baseline: HEP established  02/22/24: reports good HEP adherence Goal status: MET  2. Pt will report at least 10% improvement in grip strength in order to facilitate improved functional  strength.  Baseline: see MMT chart above 02/22/24: see MMT chart above Goal status: partially met  LONG TERM GOALS: Target date: 03/21/2024   Pt will demonstrate ability to safely lift at least 10# with hinge pattern in order to facilitate improved safety w/ household tasks such as groceries.  Baseline: limiting heavy lifting, tendency towards spine flexion w/ bending Goal status: INITIAL  2.  Pt will demonstrate global MMT improvement of at least 1 grade in tested muscles in order to facilitate improved functional strength.  Baseline: see MMT chart above  Goal status: INITIAL    3. Pt will perform 5xSTS in <8 sec in order to demonstrate reduced fall risk and improved functional independence. (MCID of 2.3sec)  Baseline: 10sec w/o UE support  Goal status: INITIAL   4. Pt will demonstrate appropriate performance of final prescribed HEP in order to facilitate improved self-management of symptoms post-discharge.   Baseline: initial HEP prescribed  Goal status: INITIAL     PLAN:  PT FREQUENCY: 1x/week  PT DURATION: 8 weeks  PLANNED INTERVENTIONS: 97164- PT Re-evaluation, 97110-Therapeutic exercises, 97530- Therapeutic activity, 97112- Neuromuscular re-education, 97535- Self Care, [id number]- Manual therapy, [phone]- Gait training, Patient/Family education, Balance training, Stair training, Taping, Cryotherapy, and Moist heat.  PLAN FOR NEXT SESSION: Review/update HEP PRN. Work on [employer] exercises as appropriate with emphasis on functional strengthening, hinging mechanics. May benefit from higher level balance assessment. Symptom modification strategies as indicated/appropriate.    Lovett Ruck PT, DPT 02/22/2024 11:29 AM

## 2024-02-29 ENCOUNTER — Ambulatory Visit: Admitting: Physical Therapy

## 2024-02-29 ENCOUNTER — Encounter: Payer: Self-pay | Admitting: Physical Therapy

## 2024-02-29 DIAGNOSIS — M6281 Muscle weakness (generalized): Secondary | ICD-10-CM

## 2024-02-29 NOTE — Therapy (Signed)
 OUTPATIENT PHYSICAL THERAPY TREATMENT   Patient Name: Brandy Doyle MRN: [id number] DOB:[birth year], 73 y.o., female Today's Date: 02/29/2024  END OF SESSION:  PT End of Session - 02/29/24 1527     Visit Number 5    Number of Visits 9    Date for PT Re-Evaluation 03/21/24    Authorization Type aetna    PT Start Time 1530    PT Stop Time 1612    PT Time Calculation (min) 42 min    Activity Tolerance Patient tolerated treatment well                 Past Medical History:  Diagnosis Date   Diabetes mellitus without complication (HCC)    Hyperlipemia    Vitamin D  deficiency    Vitreous detachment of left eye    History reviewed. No pertinent surgical history. Patient Active Problem List   Diagnosis Date Noted   TIA (transient ischemic attack) 10/26/2015   HLD (hyperlipidemia) 10/26/2015   Type 2 diabetes mellitus with circulatory disorder (HCC) 10/26/2015    PCP: Ransom Byers, MD  REFERRING PROVIDER: Gordy Lauber, MD  REFERRING DIAG: M81.0 (ICD-10-CM) - Age-related osteoporosis without current pathological fracture  Rationale for Evaluation and Treatment: Rehabilitation  THERAPY DIAG:  Muscle weakness (generalized)  ONSET DATE: NA  SUBJECTIVE:                                                                                                                                                                                          Per eval - Pt endorses history of some weakness and falls, but generally fairly active with her job and denies any overt limitations in daily activities. States she was diagnosed with osteoporosis and sent to PT to work on activity modification and strengthening. She endorses some chronic R knee pain but generally not limiting. She also states she was advised at one point to avoid heavy lifting during to an incident several years ago in which she developed bleeding in her eye.   SUBJECTIVE STATEMENT: 02/29/2024 feels good. No issues  after last session. HEP going well at home. Continues to state she feels stronger and more confident with activity   PERTINENT HISTORY:  DM, osteoporosis Self reports some arrhythmia but well managed  PAIN:  None, occasional R knee pain chronic   PRECAUTIONS: osteoporosis   WEIGHT BEARING RESTRICTIONS: No  FALLS:  Has patient fallen in last 6 months? Does endorse a fall last year while at [place], passed out. Denies any issues with balance or falls in past six months   LIVING ENVIRONMENT: Lives w/ husband, has two level home with multiple stairs.  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  DIAGNOSTIC FINDINGS:  T score -4.1 per paper referral  PATIENT SURVEYS:  Deferred given reported lack of limitation  COGNITION: Overall cognitive status: Within functional limits for tasks assessed     SENSATION: No sensory complaints endorsed   POSTURE: mild forward head, rounded shoulders    RANGE OF MOTION:     Active  Right eval Left eval  Shoulder flexion full full  Functional ER combo full full  Functional IR combo    Knee extension    Ankle dorsiflexion     (Blank rows = not tested) (Key: WFL = within functional limits not formally assessed, * = concordant pain, s = stiffness/stretching sensation, NT = not tested)  Comments:    STRENGTH TESTING:  MMT Right eval Left eval R/L 02/22/24  Shoulder flexion 4 4   Shoulder abduction 4 4   Elbow flexion 4 4   Elbow extension 4 4   Grip strength  40# 40#  45# / 40#  Hip flexion 4 4-   Hip abduction (modified sitting) 4 4   Knee flexion 4- 4-   Knee  extension 4- 4-   Ankle dorsiflexion     Ankle plantarflexion      (Blank rows = not tested) (Key: WFL = within functional limits not formally assessed, * = concordant pain, s = stiffness/stretching sensation, NT = not tested)  Comments:    FUNCTIONAL TESTS:  5xSTS: 10.29sec no UE support  GAIT: Distance walked: within clinic Assistive device utilized: None Level of assistance: Complete Independence Comments: WNL  TREATMENT DATE:  [facility] Adult PT Treatment:                                                DATE: 02/29/24 Therapeutic Exercise: High green band row 2x12 cues for posture Bridge x10 Red band horizontal abduction x12 cues for posture HEP update + education/handout  Therapeutic Activity: BW hinge x10, x10 with dowel for external cue; significant time for education/cueing  BW hinge at wall x10 significant time w/ cues for trunk positioning and post hip shift 5# STS 2x8 cues for pacing Suitcase carry 5# 2x35ft cues for posture    [facility] Adult PT Treatment:                                                DATE: 02/22/24 Therapeutic Exercise: Green band towel grip row x15 Blue band row towel grip x15 Blue band paloff x10, x12 BIL Bridge x8 cues for reduced arch Red band hip 45 deg kickback x10 BIL HEP update w/ blue band for rows/paloff  Therapeutic Activity: 6 inch step tap weaning UE support, 2x8 BIL emphasis on weight shifting and pacing  6 inch step up no UE support x10 BIL cues for pacing and controlled descent 5# STS 3x5    [facility] Adult PT Treatment:  DATE: 02/08/24 Therapeutic Exercise: Bridge 2x10 cues for breath control and comfortable ROM Standing hip 45 deg kickback 2x5 red band cues for posture and ROM Green band row 2x12 cues for posture Green band paloff press 2x8 BIL cues for posture HEP update + education/handout  Therapeutic Activity: STS x15 BW, 2# 2x8 cues for pacing and form  BW hinge at wall 2x10 cues  for increased ROM, appropriate trunk mechanics and reduced thoracic kyphosis   [facility] Adult PT Treatment:                                                DATE: 02/03/24 Therapeutic Exercise: Red band row x15 cues for posture and scapular mechanics  Green band row x15 Red band paloff press x15 BIL cues for posture HEP update + education/handout  Therapeutic Activity: STS 2x10 BW cues for pacing 4 inch fwd step up no UE support x10 each LE  8 inch step up LUE support x8 BIL BW hinge at wall x12 cues for posture and trunk mechanics Continued education re: postural modifications                                                                                                            PATIENT EDUCATION:  Education details: rationale for interventions, HEP  Person educated: Patient Education method: Explanation, Demonstration, Tactile cues, Verbal cues Education comprehension: verbalized understanding, returned demonstration, verbal cues required, tactile cues required, and needs further education     HOME EXERCISE PROGRAM: Access Code: [email or web address] URL: [email or web address] Date: 02/29/2024 Prepared by: Mayme Spearman  Program Notes - for program, please choose 2-3 exercises per day, and alternate exercises on different days- with step ups, please perform at bottom of stairs and use rail as needed for balance- can use up to 5 lb weight for sit to stands as done in clinic, please hold weight close to chest  Exercises - Standing Shoulder Row with Anchored Resistance  - 3-4 x weekly - 2-3 sets - 10 reps - Sit to Stand  - 3-4 x weekly - 2-3 sets - 5-10 reps - Standing March with Counter Support  - 3-4 x weekly - 2-3 sets - 8-10 reps - Forward Step Up  - 3-4 x weekly - 2-3 sets - 8 reps - Standing Hip Extension with Resistance at Ankles and Counter Support  - 3-4 x weekly - 2-3 sets - 5 reps - Standing Anti-Rotation Press with Anchored Resistance  - 3-4 x weekly - 2-3 sets - 10  reps - Standing Hip Hinge with Dowel  - 3-4 x weekly - 2-3 sets - 8 reps - Standing Shoulder Horizontal Abduction with Resistance  - 3-4 x weekly - 2-3 sets - 8-10 reps  ASSESSMENT:  CLINICAL IMPRESSION: 02/29/2024 Pt arrives w/ report of continued improvements in strength. Today we spend a good deal of session working on hinge mechanics -improved compared to prior sessions but  remains with tendency for fwd flexion. Thoracic compensations improve notably with repetition, continues to require cues for increased lumbar flexion. Tolerates practice with this quite well, no pain. Also able to progress functional strengthening with emphasis on posture and light resistance. No adverse events, pt denies any pain on departure. HEP update as above w/ education on appropriate alternating of exercises and home setup. Recommend continuing along current POC in order to address relevant deficits and improve functional tolerance. Pt departs today's session in no acute distress, all voiced questions/concerns addressed appropriately from PT perspective.      Per eval - Patient is a pleasant 73 y.o. woman who was seen today for physical therapy evaluation and treatment for osteoporosis. Pt denies any overt limitations and states her goals are to increase strength and learn how to manage independently. On exam she does demonstrate mild weakness throughout UE/LE that would likely benefit from skilled PT, mild postural deficits. Introductory strengthening program is prescribed and pt tolerates well in clinic, introductory education is provided on basic postural/activity modifications in context of osteoporosis. No adverse events. Recommend skilled PT to address aforementioned deficits with aim of improving functional tolerance and maximizing pt self efficacy with management of condition. Pt departs today's session in no acute distress, all voiced concerns/questions addressed appropriately from PT perspective.      OBJECTIVE  IMPAIRMENTS: decreased strength and postural dysfunction.   ACTIVITY LIMITATIONS: bending and squatting  PARTICIPATION LIMITATIONS:  denies limitations  PERSONAL FACTORS: Age, Time since onset of injury/illness/exacerbation, and 1-2 comorbidities: DM, TIA  are also affecting patient's functional outcome.   REHAB POTENTIAL: Good  CLINICAL DECISION MAKING: Stable/uncomplicated  EVALUATION COMPLEXITY: Low   GOALS:   SHORT TERM GOALS: Target date: 02/22/2024  Pt will demonstrate appropriate understanding and performance of initially prescribed HEP in order to facilitate improved independence with management of symptoms.  Baseline: HEP established  02/22/24: reports good HEP adherence Goal status: MET  2. Pt will report at least 10% improvement in grip strength in order to facilitate improved functional strength.  Baseline: see MMT chart above 02/22/24: see MMT chart above Goal status: partially met  LONG TERM GOALS: Target date: 03/21/2024   Pt will demonstrate ability to safely lift at least 10# with hinge pattern in order to facilitate improved safety w/ household tasks such as groceries.  Baseline: limiting heavy lifting, tendency towards spine flexion w/ bending Goal status: INITIAL  2.  Pt will demonstrate global MMT improvement of at least 1 grade in tested muscles in order to facilitate improved functional strength.  Baseline: see MMT chart above  Goal status: INITIAL    3. Pt will perform 5xSTS in <8 sec in order to demonstrate reduced fall risk and improved functional independence. (MCID of 2.3sec)  Baseline: 10sec w/o UE support  Goal status: INITIAL   4. Pt will demonstrate appropriate performance of final prescribed HEP in order to facilitate improved self-management of symptoms post-discharge.   Baseline: initial HEP prescribed  Goal status: INITIAL     PLAN:  PT FREQUENCY: 1x/week  PT DURATION: 8 weeks  PLANNED INTERVENTIONS: 97164- PT Re-evaluation,  97110-Therapeutic exercises, 97530- Therapeutic activity, 97112- Neuromuscular re-education, 97535- Self Care, 16109- Manual therapy, [phone]- Gait training, Patient/Family education, Balance training, Stair training, Taping, Cryotherapy, and Moist heat.  PLAN FOR NEXT SESSION: Review/update HEP PRN. Work on [employer] exercises as appropriate with emphasis on functional strengthening, hinging mechanics. May benefit from higher level balance assessment. Symptom modification strategies as indicated/appropriate.    Margette Sheldon  Yalonda Sample PT, DPT 02/29/2024 5:10 PM

## 2024-03-17 ENCOUNTER — Ambulatory Visit: Attending: Internal Medicine | Admitting: Physical Therapy

## 2024-03-17 ENCOUNTER — Encounter: Payer: Self-pay | Admitting: Physical Therapy

## 2024-03-17 DIAGNOSIS — M6281 Muscle weakness (generalized): Secondary | ICD-10-CM | POA: Insufficient documentation

## 2024-03-17 NOTE — Therapy (Signed)
 OUTPATIENT PHYSICAL THERAPY TREATMENT   Patient Name: Brandy Doyle MRN: [id number] DOB:July 04, 1951, 73 y.o., female Today's Date: 03/17/2024  END OF SESSION:  PT End of Session - 03/17/24 1437     Visit Number 6    Number of Visits 9    Date for PT Re-Evaluation 03/21/24    Authorization Type aetna    PT Start Time 1442    PT Stop Time 1522    PT Time Calculation (min) 40 min    Activity Tolerance Patient tolerated treatment well                  Past Medical History:  Diagnosis Date   Diabetes mellitus without complication (HCC)    Hyperlipemia    Vitamin D  deficiency    Vitreous detachment of left eye    History reviewed. No pertinent surgical history. Patient Active Problem List   Diagnosis Date Noted   TIA (transient ischemic attack) 10/26/2015   HLD (hyperlipidemia) 10/26/2015   Type 2 diabetes mellitus with circulatory disorder (HCC) 10/26/2015    PCP: Ransom Byers, MD  REFERRING PROVIDER: Gordy Lauber, MD  REFERRING DIAG: M81.0 (ICD-10-CM) - Age-related osteoporosis without current pathological fracture  Rationale for Evaluation and Treatment: Rehabilitation  THERAPY DIAG:  Muscle weakness (generalized)  ONSET DATE: NA  SUBJECTIVE:                                                                                                                                                                                          Per eval - Pt endorses history of some weakness and falls, but generally fairly active with her job and denies any overt limitations in daily activities. States she was diagnosed with osteoporosis and sent to PT to work on activity modification and strengthening. She endorses some chronic R knee pain but generally not limiting. She also states she was advised at one point to avoid heavy lifting during to an incident several years ago in which she developed bleeding in her eye.   SUBJECTIVE STATEMENT: 03/17/2024 Pt states she has been  doing well, busy with work. Has had some difficulty keeping straight during hinge. Otherwise continuing to feel stronger.    PERTINENT HISTORY:  DM, osteoporosis Self reports some arrhythmia but well managed  PAIN:  None, occasional R knee pain chronic   PRECAUTIONS: osteoporosis   WEIGHT BEARING RESTRICTIONS: No  FALLS:  Has patient fallen in last 6 months? Does endorse a fall last year while at [place], passed out. Denies any issues with balance or falls in past six months   LIVING ENVIRONMENT: Lives w/ husband, has two level home with  multiple stairs. 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  DIAGNOSTIC FINDINGS:  T score -4.1 per paper referral  PATIENT SURVEYS:  Deferred given reported lack of limitation  COGNITION: Overall cognitive status: Within functional limits for tasks assessed     SENSATION: No sensory complaints endorsed   POSTURE: mild forward head, rounded shoulders    RANGE OF MOTION:     Active  Right eval Left eval  Shoulder flexion full full  Functional ER combo full full  Functional IR combo    Knee extension    Ankle dorsiflexion     (Blank rows = not tested) (Key: WFL = within functional limits not formally assessed, * = concordant pain, s = stiffness/stretching sensation, NT = not tested)  Comments:    STRENGTH TESTING:  MMT Right eval Left eval R/L 02/22/24  Shoulder flexion 4 4   Shoulder abduction 4 4   Elbow flexion 4 4   Elbow extension 4 4   Grip strength  40# 40#  45# / 40#  Hip flexion 4 4-   Hip abduction (modified sitting) 4 4   Knee flexion 4- 4-    Knee extension 4- 4-   Ankle dorsiflexion     Ankle plantarflexion      (Blank rows = not tested) (Key: WFL = within functional limits not formally assessed, * = concordant pain, s = stiffness/stretching sensation, NT = not tested)  Comments:    FUNCTIONAL TESTS:  5xSTS: 10.29sec no UE support  GAIT: Distance walked: within clinic Assistive device utilized: None Level of assistance: Complete Independence Comments: WNL  TREATMENT DATE:  [facility] Adult PT Treatment:                                                DATE: 03/17/24 Therapeutic Exercise: Red band 3-3-3 tempo horizontal abduction x10 cues for posture/pacing Red band paloff walkouts x5 BIL cues for posture and arm positioning HEP update + education  Therapeutic Activity: Suitcase carry 5# 2x23ft  BW hinge w/ dowel x12 total extensive cues for knee/trunk positioning 5# STS 2x10 cues for pacing Raised mat modified plank 2x30sec cues for positioning Raised mat modified plank shoulder taps x8 BIL    [facility] Adult PT Treatment:                                                DATE: 02/29/24 Therapeutic Exercise: High green band row 2x12 cues for posture Bridge x10 Red band horizontal abduction x12 cues for posture HEP update + education/handout  Therapeutic Activity: BW hinge x10, x10 with dowel for external cue; significant time for education/cueing  BW hinge at wall x10 significant time w/ cues for trunk positioning and post hip shift 5# STS 2x8 cues for pacing Suitcase carry 5# 2x95ft cues for posture    [facility] Adult PT Treatment:  DATE: 02/22/24 Therapeutic Exercise: Green band towel grip row x15 Blue band row towel grip x15 Blue band paloff x10, x12 BIL Bridge x8 cues for reduced arch Red band hip 45 deg kickback x10 BIL HEP update w/ blue band for rows/paloff  Therapeutic Activity: 6 inch step tap weaning UE support, 2x8 BIL emphasis on weight shifting and pacing  6  inch step up no UE support x10 BIL cues for pacing and controlled descent 5# STS 3x5     PATIENT EDUCATION:  Education details: rationale for interventions, HEP  Person educated: Patient Education method: Explanation, Demonstration, Tactile cues, Verbal cues Education comprehension: verbalized understanding, returned demonstration, verbal cues required, tactile cues required, and needs further education     HOME EXERCISE PROGRAM: Access Code: [email or web address] URL: [email or web address] Date: 03/17/2024 Prepared by: Mayme Spearman  Program Notes - for program, please choose 2-3 exercises per day, and alternate exercises on different days- with step ups, please perform at bottom of stairs and use rail as needed for balance- can use up to 5 lb weight for sit to stands as done in clinic, please hold weight close to chest  Exercises - Standing Shoulder Row with Anchored Resistance  - 3-4 x weekly - 2-3 sets - 10 reps - Sit to Stand  - 3-4 x weekly - 2-3 sets - 5-10 reps - Standing March with Counter Support  - 3-4 x weekly - 2-3 sets - 8-10 reps - Forward Step Up  - 3-4 x weekly - 2-3 sets - 8 reps - Standing Hip Extension with Resistance at Ankles and Counter Support  - 3-4 x weekly - 2-3 sets - 5 reps - Standing Hip Hinge with Dowel  - 3-4 x weekly - 2-3 sets - 8 reps - Standing Shoulder Horizontal Abduction with Resistance  - 3-4 x weekly - 2-3 sets - 8-10 reps - Full Plank on Counter, Opposite Shoulder Taps  - 3-4 x weekly - 2-3 sets - 8 reps - Anti-Rotation Sidestepping with Resistance  - 3-4 x weekly - 2-3 sets - 5 reps  ASSESSMENT:  CLINICAL IMPRESSION: 03/17/2024 Pt arrives w/ report of continued improvements in strength. Continuing to work on functional strengthening, postural endurance, and WB activities. Continuing to work on hinge pattern requiring cues as above, improves w/ repetition and increased time for education. No adverse events, tolerates session well without  issue. Pt has one more scheduled visit - plan for recertification and goals assessment to look at appropriateness of discharge vs extension of POC. Pt departs today's session in no acute distress, all voiced questions/concerns addressed appropriately from PT perspective.       Per eval - Patient is a pleasant 73 y.o. woman who was seen today for physical therapy evaluation and treatment for osteoporosis. Pt denies any overt limitations and states her goals are to increase strength and learn how to manage independently. On exam she does demonstrate mild weakness throughout UE/LE that would likely benefit from skilled PT, mild postural deficits. Introductory strengthening program is prescribed and pt tolerates well in clinic, introductory education is provided on basic postural/activity modifications in context of osteoporosis. No adverse events. Recommend skilled PT to address aforementioned deficits with aim of improving functional tolerance and maximizing pt self efficacy with management of condition. Pt departs today's session in no acute distress, all voiced concerns/questions addressed appropriately from PT perspective.      OBJECTIVE IMPAIRMENTS: decreased strength and postural dysfunction.   ACTIVITY LIMITATIONS: bending and squatting  PARTICIPATION LIMITATIONS: denies limitations  PERSONAL FACTORS: Age, Time since onset of injury/illness/exacerbation, and 1-2 comorbidities: DM, TIA are also affecting patient's functional outcome.   REHAB POTENTIAL: Good  CLINICAL DECISION MAKING: Stable/uncomplicated  EVALUATION COMPLEXITY: Low   GOALS:   SHORT TERM GOALS: Target date: 02/22/2024  Pt will demonstrate appropriate understanding and performance of initially prescribed HEP in order to facilitate improved independence with management of symptoms.  Baseline: HEP established  02/22/24: reports good HEP adherence Goal status: MET  2. Pt will report at least 10% improvement in grip  strength in order to facilitate improved functional strength.  Baseline: see MMT chart above 02/22/24: see MMT chart above Goal status: partially met  LONG TERM GOALS: Target date: 03/21/2024   Pt will demonstrate ability to safely lift at least 10# with hinge pattern in order to facilitate improved safety w/ household tasks such as groceries.  Baseline: limiting heavy lifting, tendency towards spine flexion w/ bending Goal status: INITIAL  2.  Pt will demonstrate global MMT improvement of at least 1 grade in tested muscles in order to facilitate improved functional strength.  Baseline: see MMT chart above  Goal status: INITIAL    3. Pt will perform 5xSTS in <8 sec in order to demonstrate reduced fall risk and improved functional independence. (MCID of 2.3sec)  Baseline: 10sec w/o UE support  Goal status: INITIAL   4. Pt will demonstrate appropriate performance of final prescribed HEP in order to facilitate improved self-management of symptoms post-discharge.   Baseline: initial HEP prescribed  Goal status: INITIAL     PLAN:  PT FREQUENCY: 1x/week  PT DURATION: 8 weeks  PLANNED INTERVENTIONS: 97164- PT Re-evaluation, 97110-Therapeutic exercises, 97530- Therapeutic activity, 97112- Neuromuscular re-education, 97535- Self Care, 16109- Manual therapy, [phone]- Gait training, Patient/Family education, Balance training, Stair training, Taping, Cryotherapy, and Moist heat.  PLAN FOR NEXT SESSION: Review/update HEP PRN. Work on [employer] exercises as appropriate with emphasis on functional strengthening, hinging mechanics. May benefit from higher level balance assessment. Symptom modification strategies as indicated/appropriate.    Lovett Ruck PT, DPT 03/17/2024 3:25 PM

## 2024-03-23 NOTE — Therapy (Signed)
 OUTPATIENT PHYSICAL THERAPY TREATMENT + DISCHARGE   Patient Name: Brandy Doyle MRN: [id number] DOB:[birth year], 73 y.o., female Today's Date: 03/23/2024  PHYSICAL THERAPY DISCHARGE SUMMARY  Visits from Start of Care: ***  Current functional level related to goals / functional outcomes: Denies limitation   Remaining deficits: Mild weakness, tendency towards flexion with lifting at times    Education / Equipment: HEP, discharge education, strategies to monitor lifting mechanics and continue self progression, follow up with provider ***    Patient agrees to discharge. Patient goals were {OP Goals:25702::"met"}. Patient is being discharged due to {OP Discharge Reasons:25703::"meeting the stated rehab goals."}   END OF SESSION:         Past Medical History:  Diagnosis Date   Diabetes mellitus without complication (HCC)    Hyperlipemia    Vitamin D  deficiency    Vitreous detachment of left eye    No past surgical history on file. Patient Active Problem List   Diagnosis Date Noted   TIA (transient ischemic attack) 10/26/2015   HLD (hyperlipidemia) 10/26/2015   Type 2 diabetes mellitus with circulatory disorder (HCC) 10/26/2015    PCP: Ransom Byers, MD  REFERRING PROVIDER: Gordy Lauber, MD  REFERRING DIAG: M81.0 (ICD-10-CM) - Age-related osteoporosis without current pathological fracture  Rationale for Evaluation and Treatment: Rehabilitation  THERAPY DIAG:  No diagnosis found.  ONSET DATE: NA  SUBJECTIVE:                                                                                                                                                                                          Per eval - Pt endorses history of some weakness and falls, but generally fairly active with her job and denies any overt limitations in daily activities. States she was diagnosed with osteoporosis and sent to PT to work on activity modification and strengthening. She endorses  some chronic R knee pain but generally not limiting. She also states she was advised at one point to avoid heavy lifting during to an incident several years ago in which she developed bleeding in her eye.   SUBJECTIVE STATEMENT: 03/23/2024 ***  *** Pt states she has been doing well, busy with work. Has had some difficulty keeping straight during hinge. Otherwise continuing to feel stronger.    PERTINENT HISTORY:  DM, osteoporosis Self reports some arrhythmia but well managed  PAIN:  None, occasional R knee pain chronic   PRECAUTIONS: osteoporosis   WEIGHT BEARING RESTRICTIONS: No  FALLS:  Has patient fallen in last 6 months? Does endorse a fall last year while at [place], passed out. Denies any issues with balance or falls in past  six months   LIVING ENVIRONMENT: Lives w/ husband, has two level home with multiple stairs. 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  DIAGNOSTIC FINDINGS:  T score -4.1 per paper referral  PATIENT SURVEYS:  Deferred given reported lack of limitation  COGNITION: Overall cognitive status: Within functional limits for tasks assessed     SENSATION: No sensory complaints endorsed   POSTURE: mild forward head, rounded shoulders    RANGE OF MOTION:     Active  Right eval Left eval  Shoulder flexion full full  Functional ER combo full full  Functional IR combo    Knee extension    Ankle dorsiflexion     (Blank rows = not tested) (Key: WFL = within functional limits not formally assessed, * = concordant pain, s = stiffness/stretching sensation, NT = not tested)  Comments:     STRENGTH TESTING:  MMT Right eval Left eval R/L 02/22/24 R/L 03/24/24 ***  Shoulder flexion 4 4    Shoulder abduction 4 4    Elbow flexion 4 4    Elbow extension 4 4    Grip strength  40# 40#  45# / 40#   Hip flexion 4 4-    Hip abduction (modified sitting) 4 4    Knee flexion 4- 4-    Knee extension 4- 4-    Ankle dorsiflexion      Ankle plantarflexion       (Blank rows = not tested) (Key: WFL = within functional limits not formally assessed, * = concordant pain, s = stiffness/stretching sensation, NT = not tested)  Comments:    FUNCTIONAL TESTS:  5xSTS: 10.29sec no UE support  03/24/24: - 5xSTS *** - 5# lift *** ; 10# lift ***   GAIT: Distance walked: within clinic Assistive device utilized: None Level of assistance: Complete Independence Comments: WNL  TREATMENT DATE:  [facility] Adult PT Treatment:                                                DATE: 03/24/24 Therapeutic Exercise: *** Manual Therapy: *** Neuromuscular re-ed: *** Therapeutic Activity: *** Modalities: *** Self Care: ***    [facility] Adult PT Treatment:                                                DATE: 03/17/24 Therapeutic Exercise: Red band 3-3-3 tempo horizontal abduction x10 cues for posture/pacing Red band paloff walkouts x5 BIL cues for posture and arm positioning HEP update + education  Therapeutic Activity: Suitcase carry 5# 2x31ft  BW hinge w/ dowel x12 total extensive cues for knee/trunk positioning 5# STS 2x10 cues for pacing Raised mat modified plank 2x30sec cues for positioning Raised mat modified plank shoulder taps x8 BIL    [facility] Adult PT Treatment:  DATE: 02/29/24 Therapeutic Exercise: High green band row 2x12 cues for posture Bridge x10 Red band horizontal abduction x12 cues for posture HEP update + education/handout  Therapeutic Activity: BW hinge x10, x10 with dowel for external cue; significant time for education/cueing  BW  hinge at wall x10 significant time w/ cues for trunk positioning and post hip shift 5# STS 2x8 cues for pacing Suitcase carry 5# 2x43ft cues for posture   PATIENT EDUCATION:  Education details: PT POC, PT goals, progress with PT thus far, discharge planning, HEP, follow up with provider  Person educated: Patient Education method: Explanation, Demonstration, Verbal cues Education comprehension: verbalized understanding, returned demonstration   HOME EXERCISE PROGRAM: Access Code: [email or web address] URL: [email or web address] Date: 03/17/2024 Prepared by: Mayme Spearman  Program Notes - for program, please choose 2-3 exercises per day, and alternate exercises on different days- with step ups, please perform at bottom of stairs and use rail as needed for balance- can use up to 5 lb weight for sit to stands as done in clinic, please hold weight close to chest  Exercises - Standing Shoulder Row with Anchored Resistance  - 3-4 x weekly - 2-3 sets - 10 reps - Sit to Stand  - 3-4 x weekly - 2-3 sets - 5-10 reps - Standing March with Counter Support  - 3-4 x weekly - 2-3 sets - 8-10 reps - Forward Step Up  - 3-4 x weekly - 2-3 sets - 8 reps - Standing Hip Extension with Resistance at Ankles and Counter Support  - 3-4 x weekly - 2-3 sets - 5 reps - Standing Hip Hinge with Dowel  - 3-4 x weekly - 2-3 sets - 8 reps - Standing Shoulder Horizontal Abduction with Resistance  - 3-4 x weekly - 2-3 sets - 8-10 reps - Full Plank on Counter, Opposite Shoulder Taps  - 3-4 x weekly - 2-3 sets - 8 reps - Anti-Rotation Sidestepping with Resistance  - 3-4 x weekly - 2-3 sets - 5 reps  ASSESSMENT:  CLINICAL IMPRESSION: 03/23/2024 ***  *** Pt arrives w/ report of continued improvements in strength. Continuing to work on functional strengthening, postural endurance, and WB activities. Continuing to work on hinge pattern requiring cues as above, improves w/ repetition and increased time for education. No  adverse events, tolerates session well without issue. Pt has one more scheduled visit - plan for recertification and goals assessment to look at appropriateness of discharge vs extension of POC. Pt departs today's session in no acute distress, all voiced questions/concerns addressed appropriately from PT perspective.       Per eval - Patient is a pleasant 73 y.o. woman who was seen today for physical therapy evaluation and treatment for osteoporosis. Pt denies any overt limitations and states her goals are to increase strength and learn how to manage independently. On exam she does demonstrate mild weakness throughout UE/LE that would likely benefit from skilled PT, mild postural deficits. Introductory strengthening program is prescribed and pt tolerates well in clinic, introductory education is provided on basic postural/activity modifications in context of osteoporosis. No adverse events. Recommend skilled PT to address aforementioned deficits with aim of improving functional tolerance and maximizing pt self efficacy with management of condition. Pt departs today's session in no acute distress, all voiced concerns/questions addressed appropriately from PT perspective.      OBJECTIVE IMPAIRMENTS: decreased strength and postural dysfunction.   ACTIVITY LIMITATIONS: bending and squatting  PARTICIPATION LIMITATIONS: denies limitations  PERSONAL FACTORS: Age, Time since onset of injury/illness/exacerbation,  and 1-2 comorbidities: DM, TIA are also affecting patient's functional outcome.   REHAB POTENTIAL: Good  CLINICAL DECISION MAKING: Stable/uncomplicated  EVALUATION COMPLEXITY: Low   GOALS:   SHORT TERM GOALS: Target date: 02/22/2024  Pt will demonstrate appropriate understanding and performance of initially prescribed HEP in order to facilitate improved independence with management of symptoms.  Baseline: HEP established  02/22/24: reports good HEP adherence Goal status: MET  2. Pt will  report at least 10% improvement in grip strength in order to facilitate improved functional strength.  Baseline: see MMT chart above 02/22/24: see MMT chart above 03/24/24: *** Goal status: ***  LONG TERM GOALS: Target date: 03/21/2024   Pt will demonstrate ability to safely lift at least 10# with hinge pattern in order to facilitate improved safety w/ household tasks such as groceries.  Baseline: limiting heavy lifting, tendency towards spine flexion w/ bending 03/24/24: *** Goal status: ***  2.  Pt will demonstrate global MMT improvement of at least 1 grade in tested muscles in order to facilitate improved functional strength.  Baseline: see MMT chart above  03/24/24: ***  Goal status: ***   3. Pt will perform 5xSTS in <8 sec in order to demonstrate reduced fall risk and improved functional independence. (MCID of 2.3sec)  Baseline: 10sec w/o UE support  03/24/24: ***  Goal status: ***  4. Pt will demonstrate appropriate performance of final prescribed HEP in order to facilitate improved self-management of symptoms post-discharge.   Baseline: initial HEP prescribed  03/24/24: ***  Goal status: ***  PLAN:  PT FREQUENCY: 1x/week  PT DURATION: 8 weeks  PLANNED INTERVENTIONS: 97164- PT Re-evaluation, 97110-Therapeutic exercises, 97530- Therapeutic activity, 97112- Neuromuscular re-education, 97535- Self Care, [id number]- Manual therapy, [phone]- Gait training, Patient/Family education, Balance training, Stair training, Taping, Cryotherapy, and Moist heat.  PLAN FOR NEXT SESSION: Review/update HEP PRN. Work on [employer] exercises as appropriate with emphasis on functional strengthening, hinging mechanics. May benefit from higher level balance assessment. Symptom modification strategies as indicated/appropriate.    Lovett Ruck PT, DPT 03/23/2024 8:05 AM

## 2024-03-24 ENCOUNTER — Encounter: Payer: Self-pay | Admitting: Physical Therapy

## 2024-03-24 ENCOUNTER — Ambulatory Visit: Admitting: Physical Therapy

## 2024-03-24 DIAGNOSIS — M6281 Muscle weakness (generalized): Secondary | ICD-10-CM

## 2024-04-15 ENCOUNTER — Other Ambulatory Visit: Payer: Self-pay | Admitting: Family Medicine

## 2024-04-15 DIAGNOSIS — Z1231 Encounter for screening mammogram for malignant neoplasm of breast: Secondary | ICD-10-CM

## 2024-04-22 ENCOUNTER — Ambulatory Visit
Admission: RE | Admit: 2024-04-22 | Discharge: 2024-04-22 | Disposition: A | Discharge status: Home / Self Care | Source: Ambulatory Visit | Attending: Family Medicine

## 2024-04-22 DIAGNOSIS — Z1231 Encounter for screening mammogram for malignant neoplasm of breast: Secondary | ICD-10-CM
